# Patient Record
Sex: Male | Born: 1955 | Race: White | Hispanic: No | Smoking: Current every day smoker
Health system: Southern US, Community
[De-identification: ages and names within clinical notes are randomized; demographics above are authoritative.]

## PROBLEM LIST (undated history)

## (undated) DIAGNOSIS — B20 Human immunodeficiency virus [HIV] disease: Secondary | ICD-10-CM

## (undated) DIAGNOSIS — Z87442 Personal history of urinary calculi: Secondary | ICD-10-CM

## (undated) DIAGNOSIS — G720 Drug-induced myopathy: Secondary | ICD-10-CM

## (undated) DIAGNOSIS — R519 Headache, unspecified: Secondary | ICD-10-CM

## (undated) DIAGNOSIS — J439 Emphysema, unspecified: Secondary | ICD-10-CM

## (undated) DIAGNOSIS — I10 Essential (primary) hypertension: Secondary | ICD-10-CM

## (undated) DIAGNOSIS — G43909 Migraine, unspecified, not intractable, without status migrainosus: Secondary | ICD-10-CM

## (undated) DIAGNOSIS — I471 Supraventricular tachycardia, unspecified: Secondary | ICD-10-CM

## (undated) DIAGNOSIS — Z21 Asymptomatic human immunodeficiency virus [HIV] infection status: Secondary | ICD-10-CM

## (undated) DIAGNOSIS — I2510 Atherosclerotic heart disease of native coronary artery without angina pectoris: Secondary | ICD-10-CM

## (undated) DIAGNOSIS — N401 Enlarged prostate with lower urinary tract symptoms: Secondary | ICD-10-CM

## (undated) DIAGNOSIS — E119 Type 2 diabetes mellitus without complications: Secondary | ICD-10-CM

## (undated) HISTORY — PX: OTHER SURGICAL HISTORY: SHX169

## (undated) HISTORY — DX: Human immunodeficiency virus (HIV) disease: B20

## (undated) HISTORY — PX: APPENDECTOMY: SHX54

## (undated) HISTORY — DX: Essential (primary) hypertension: I10

## (undated) HISTORY — PX: CHOLECYSTECTOMY: SHX55

## (undated) HISTORY — PX: COLONOSCOPY: SHX174

## (undated) HISTORY — DX: Type 2 diabetes mellitus without complications: E11.9

## (undated) HISTORY — PX: ESOPHAGOGASTRODUODENOSCOPY: SHX1529

## (undated) HISTORY — PX: TRIGGER FINGER RELEASE: SHX641

---

## 1999-08-20 ENCOUNTER — Emergency Department (HOSPITAL_COMMUNITY): Admission: EM | Admit: 1999-08-20 | Discharge: 1999-08-20 | Payer: Self-pay | Admitting: Emergency Medicine

## 2006-01-08 ENCOUNTER — Emergency Department: Payer: Self-pay | Admitting: Emergency Medicine

## 2008-03-19 ENCOUNTER — Ambulatory Visit: Payer: Self-pay | Admitting: Internal Medicine

## 2008-03-26 ENCOUNTER — Ambulatory Visit: Payer: Self-pay | Admitting: Unknown Physician Specialty

## 2008-04-03 ENCOUNTER — Ambulatory Visit: Payer: Self-pay | Admitting: Internal Medicine

## 2008-04-18 ENCOUNTER — Ambulatory Visit: Payer: Self-pay | Admitting: Internal Medicine

## 2008-05-19 ENCOUNTER — Ambulatory Visit: Payer: Self-pay | Admitting: Internal Medicine

## 2008-05-22 ENCOUNTER — Ambulatory Visit: Payer: Self-pay | Admitting: Internal Medicine

## 2008-06-18 ENCOUNTER — Ambulatory Visit: Payer: Self-pay | Admitting: Internal Medicine

## 2008-09-16 ENCOUNTER — Ambulatory Visit: Payer: Self-pay | Admitting: Internal Medicine

## 2008-09-18 ENCOUNTER — Ambulatory Visit: Payer: Self-pay | Admitting: Internal Medicine

## 2008-10-17 ENCOUNTER — Ambulatory Visit: Payer: Self-pay | Admitting: Internal Medicine

## 2008-11-20 ENCOUNTER — Ambulatory Visit: Payer: Self-pay | Admitting: Urology

## 2009-03-07 ENCOUNTER — Ambulatory Visit: Payer: Self-pay | Admitting: Specialist

## 2009-06-18 ENCOUNTER — Ambulatory Visit: Payer: Self-pay | Admitting: Gastroenterology

## 2009-11-24 ENCOUNTER — Ambulatory Visit: Payer: Self-pay | Admitting: Urology

## 2009-12-09 ENCOUNTER — Ambulatory Visit: Payer: Self-pay | Admitting: General Practice

## 2009-12-17 ENCOUNTER — Ambulatory Visit: Payer: Self-pay | Admitting: General Practice

## 2009-12-19 ENCOUNTER — Ambulatory Visit: Payer: Self-pay | Admitting: Urology

## 2009-12-24 ENCOUNTER — Ambulatory Visit: Payer: Self-pay | Admitting: Urology

## 2009-12-25 ENCOUNTER — Emergency Department: Payer: Self-pay | Admitting: Emergency Medicine

## 2010-08-29 IMAGING — CR DG ABDOMEN 1V
1 series · 2 of 2 positions shown · non-contrast
Comparison: none

REASON FOR EXAM: pain after removing stent
COMMENTS:

[Series 1: view not recorded · 0.17mm/px · 2 of 2 slices shown]
[im 1/2]
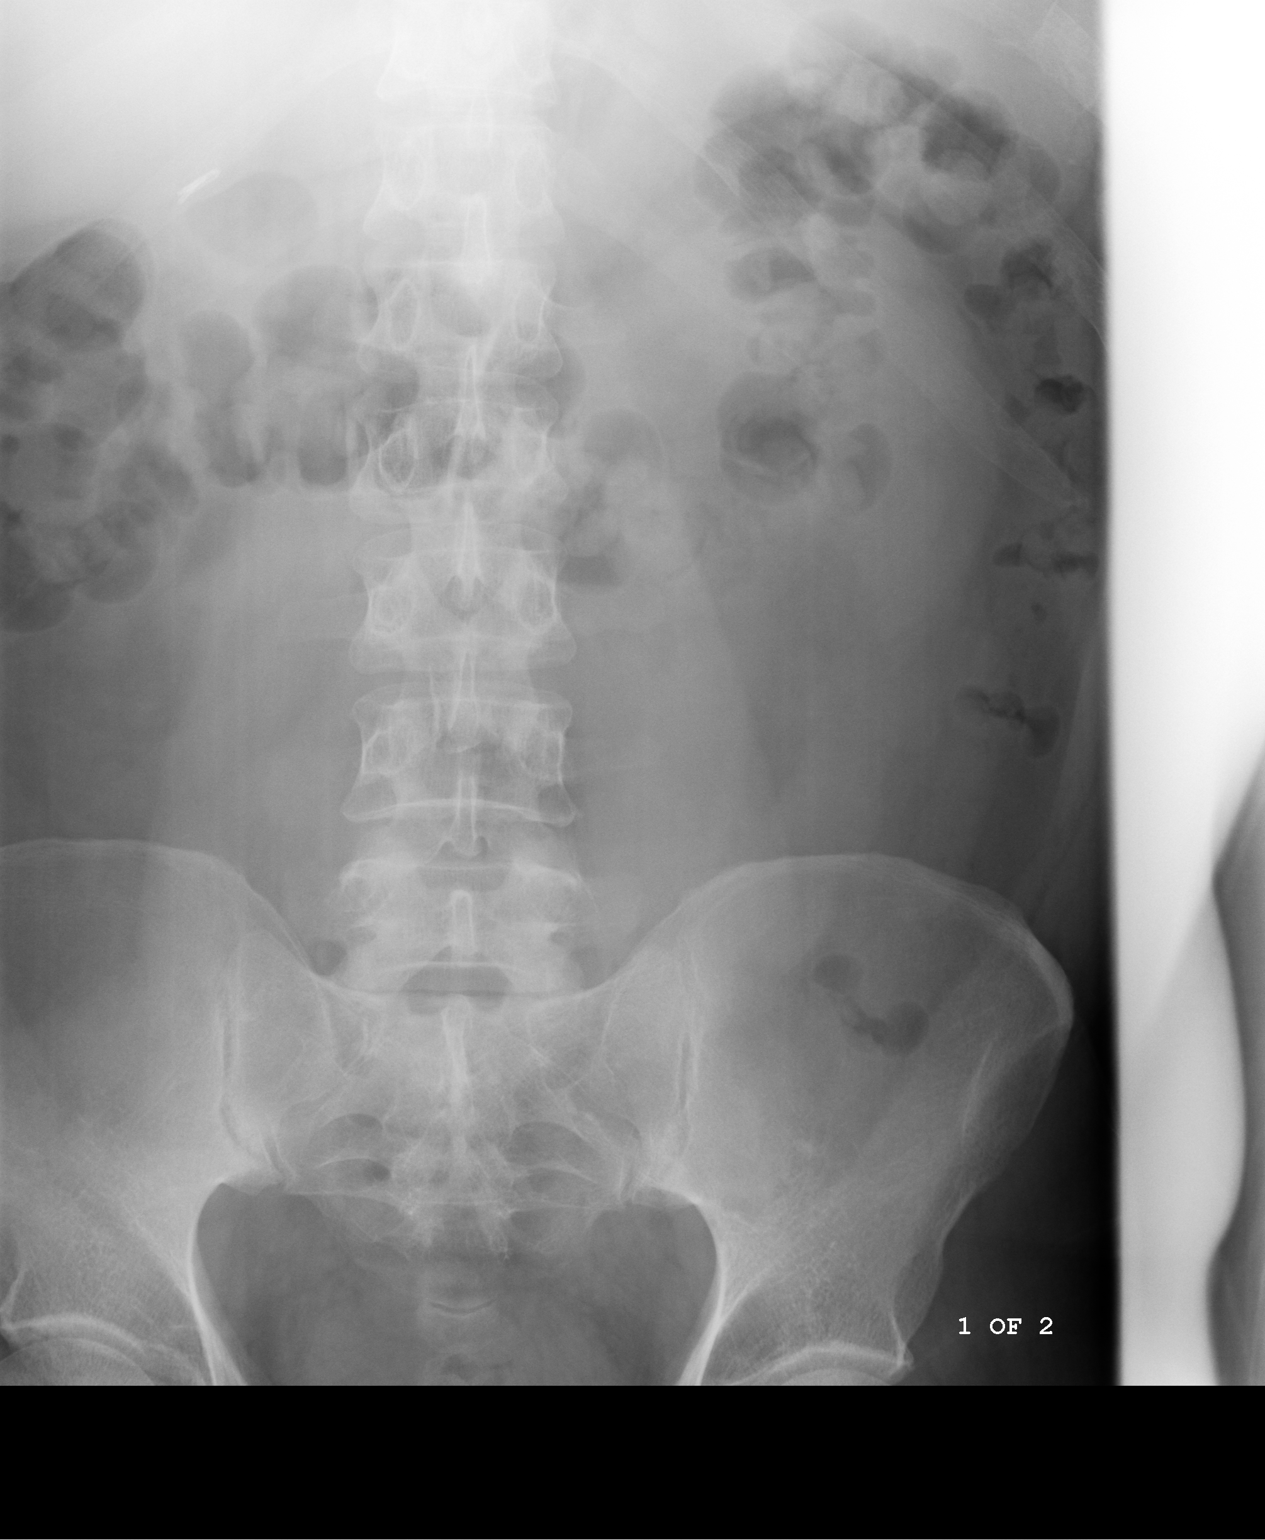
[im 2/2]
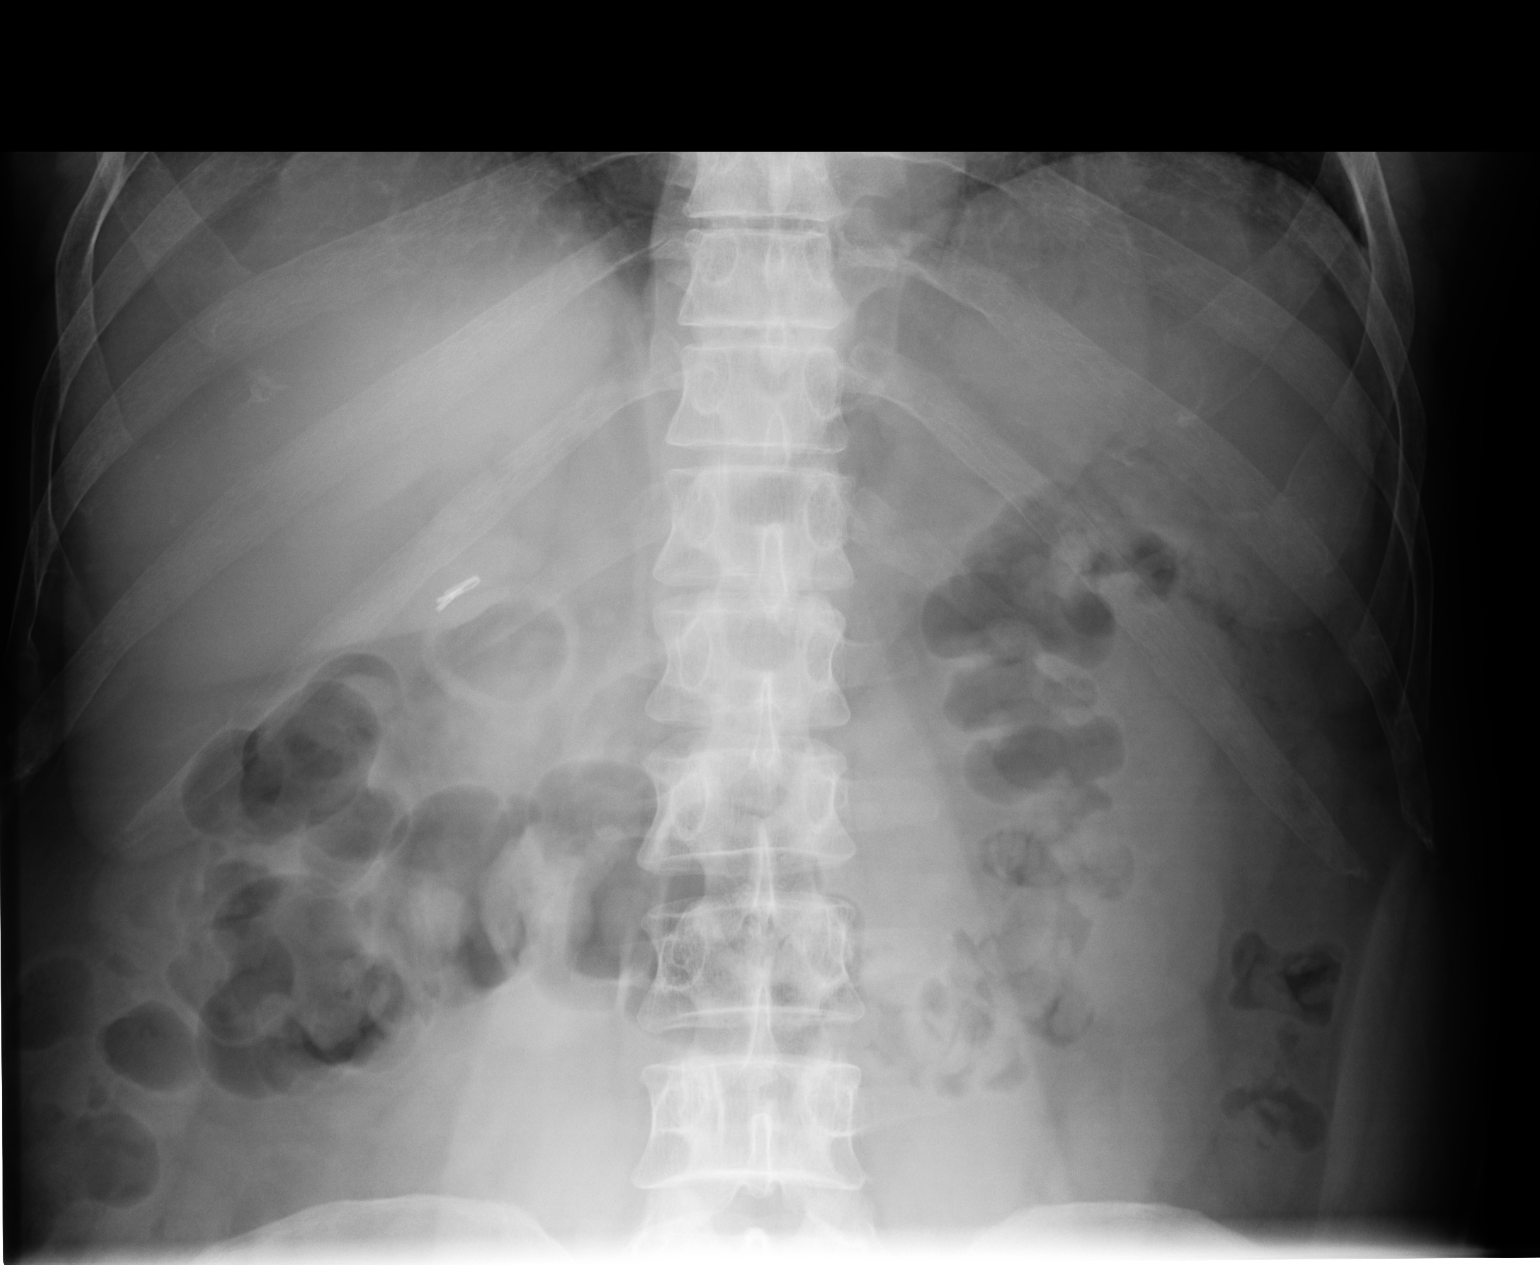

[2 of 2 positions shown; findings below may reference images not displayed]

PROCEDURE:     DXR - DXR KIDNEY URETER BLADDER  - December 25, 2009 [DATE]

RESULT:     AP view of the abdomen was obtained. No ureteral stent is seen
consistent with the history that the stent has been removed. No residual
fragment is observed. No definite renal or ureteral calcifications are
noted. The bowel gas pattern is normal. Postoperative metallic clips are
noted in the right upper quadrant. The psoas margins are visualized
bilaterally. The osseous structures are normal in appearance.
IMPRESSION: 1.     No significant abnormalities are noted.

## 2011-01-07 ENCOUNTER — Ambulatory Visit: Payer: Self-pay | Admitting: Urology

## 2011-03-18 ENCOUNTER — Ambulatory Visit: Payer: Self-pay | Admitting: Urology

## 2011-09-23 ENCOUNTER — Ambulatory Visit: Payer: Self-pay | Admitting: Internal Medicine

## 2012-03-07 ENCOUNTER — Ambulatory Visit: Payer: Self-pay | Admitting: Specialist

## 2013-01-10 ENCOUNTER — Ambulatory Visit: Payer: Self-pay | Admitting: Urology

## 2014-05-28 ENCOUNTER — Ambulatory Visit: Payer: Self-pay | Admitting: Internal Medicine

## 2014-06-18 ENCOUNTER — Ambulatory Visit: Payer: Self-pay | Admitting: Internal Medicine

## 2014-07-19 ENCOUNTER — Ambulatory Visit: Payer: Self-pay | Admitting: Internal Medicine

## 2017-11-23 ENCOUNTER — Telehealth: Payer: Self-pay | Admitting: *Deleted

## 2017-11-23 NOTE — Telephone Encounter (Signed)
Received referral for low dose lung cancer screening CT scan. Message left at phone number listed in EMR for patient to call me back to facilitate scheduling scan.  

## 2017-11-24 ENCOUNTER — Telehealth: Payer: Self-pay | Admitting: *Deleted

## 2017-11-24 DIAGNOSIS — Z122 Encounter for screening for malignant neoplasm of respiratory organs: Secondary | ICD-10-CM

## 2017-11-24 DIAGNOSIS — Z87891 Personal history of nicotine dependence: Secondary | ICD-10-CM

## 2017-11-24 NOTE — Telephone Encounter (Signed)
Received referral for initial lung cancer screening scan. Contacted patient and obtained smoking history,(current, 51.25 pack year) as well as answering questions related to screening process. Patient denies signs of lung cancer such as weight loss or hemoptysis. Patient denies comorbidity that would prevent curative treatment if lung cancer were found. Patient is scheduled for shared decision making visit and CT scan on 12/08/17.

## 2017-12-08 ENCOUNTER — Encounter: Payer: Self-pay | Admitting: Nurse Practitioner

## 2017-12-08 ENCOUNTER — Ambulatory Visit
Admission: RE | Admit: 2017-12-08 | Discharge: 2017-12-08 | Disposition: A | Discharge status: Home / Self Care | Payer: Managed Care, Other (non HMO) | Source: Ambulatory Visit | Attending: Nurse Practitioner | Admitting: Nurse Practitioner

## 2017-12-08 ENCOUNTER — Inpatient Hospital Stay: Payer: Managed Care, Other (non HMO) | Attending: Nurse Practitioner | Admitting: Nurse Practitioner

## 2017-12-08 DIAGNOSIS — R590 Localized enlarged lymph nodes: Secondary | ICD-10-CM | POA: Diagnosis not present

## 2017-12-08 DIAGNOSIS — Z8572 Personal history of non-Hodgkin lymphomas: Secondary | ICD-10-CM | POA: Diagnosis not present

## 2017-12-08 DIAGNOSIS — Z87891 Personal history of nicotine dependence: Secondary | ICD-10-CM

## 2017-12-08 DIAGNOSIS — J439 Emphysema, unspecified: Secondary | ICD-10-CM | POA: Insufficient documentation

## 2017-12-08 DIAGNOSIS — Z122 Encounter for screening for malignant neoplasm of respiratory organs: Secondary | ICD-10-CM

## 2017-12-08 DIAGNOSIS — I700 Atherosclerosis of aorta: Secondary | ICD-10-CM | POA: Insufficient documentation

## 2017-12-08 NOTE — Progress Notes (Signed)
In accordance with CMS guidelines, patient has met eligibility criteria including age, absence of signs or symptoms of lung cancer.  Social History   Tobacco Use  . Smoking status: Current Every Day Smoker    Packs/day: 1.25    Years: 41.00    Pack years: 51.25    Types: Cigarettes  Substance Use Topics  . Alcohol use: Not on file  . Drug use: Not on file      A shared decision-making session was conducted prior to the performance of CT scan. This includes one or more decision aids, includes benefits and harms of screening, follow-up diagnostic testing, over-diagnosis, false positive rate, and total radiation exposure.   Counseling on the importance of adherence to annual lung cancer LDCT screening, impact of co-morbidities, and ability or willingness to undergo diagnosis and treatment is imperative for compliance of the program.   Counseling on the importance of continued smoking cessation for former smokers; the importance of smoking cessation for current smokers, and information about tobacco cessation interventions have been given to patient including [place] [facility] and [facility] quit [place] programs.   Written order for lung cancer screening with LDCT has been given to the patient and any and all questions have been answered to the best of my abilities.    Yearly follow up will be coordinated by Shawn Perkins, Thoracic Navigator.  Lauren Allen, DNP, AGNP-C [facility] [phone] (work cell) [phone] (office) 12/08/17 5:10 PM   

## 2017-12-14 ENCOUNTER — Telehealth: Payer: Self-pay | Admitting: *Deleted

## 2017-12-14 NOTE — Telephone Encounter (Signed)
Patient called and given lung screening results by [facility] RN thoracic navigator. Also reviewed incidental findings. Patient's PCP will receive a copy of the CT report.

## 2018-08-15 ENCOUNTER — Encounter: Payer: Self-pay | Admitting: Infectious Diseases

## 2018-08-15 ENCOUNTER — Ambulatory Visit: Payer: Managed Care, Other (non HMO) | Attending: Infectious Diseases | Admitting: Infectious Diseases

## 2018-08-15 ENCOUNTER — Other Ambulatory Visit
Admission: RE | Admit: 2018-08-15 | Discharge: 2018-08-15 | Disposition: A | Discharge status: Home / Self Care | Payer: Managed Care, Other (non HMO) | Source: Ambulatory Visit | Attending: Infectious Diseases | Admitting: Infectious Diseases

## 2018-08-15 VITALS — BP 151/85 | HR 107 | Temp 97.4°F | Ht 70.0 in | Wt 202.5 lb

## 2018-08-15 DIAGNOSIS — Z7984 Long term (current) use of oral hypoglycemic drugs: Secondary | ICD-10-CM

## 2018-08-15 DIAGNOSIS — B20 Human immunodeficiency virus [HIV] disease: Secondary | ICD-10-CM | POA: Diagnosis present

## 2018-08-15 DIAGNOSIS — Z881 Allergy status to other antibiotic agents status: Secondary | ICD-10-CM

## 2018-08-15 DIAGNOSIS — Z9049 Acquired absence of other specified parts of digestive tract: Secondary | ICD-10-CM

## 2018-08-15 DIAGNOSIS — Z87442 Personal history of urinary calculi: Secondary | ICD-10-CM

## 2018-08-15 DIAGNOSIS — Z21 Asymptomatic human immunodeficiency virus [HIV] infection status: Secondary | ICD-10-CM

## 2018-08-15 DIAGNOSIS — R251 Tremor, unspecified: Secondary | ICD-10-CM | POA: Diagnosis not present

## 2018-08-15 DIAGNOSIS — F17200 Nicotine dependence, unspecified, uncomplicated: Secondary | ICD-10-CM

## 2018-08-15 DIAGNOSIS — E119 Type 2 diabetes mellitus without complications: Secondary | ICD-10-CM

## 2018-08-15 DIAGNOSIS — Z885 Allergy status to narcotic agent status: Secondary | ICD-10-CM

## 2018-08-15 DIAGNOSIS — I10 Essential (primary) hypertension: Secondary | ICD-10-CM

## 2018-08-15 DIAGNOSIS — G43909 Migraine, unspecified, not intractable, without status migrainosus: Secondary | ICD-10-CM

## 2018-08-15 DIAGNOSIS — Z888 Allergy status to other drugs, medicaments and biological substances status: Secondary | ICD-10-CM

## 2018-08-15 DIAGNOSIS — Z79899 Other long term (current) drug therapy: Secondary | ICD-10-CM

## 2018-08-15 HISTORY — DX: Type 2 diabetes mellitus without complications: E11.9

## 2018-08-15 HISTORY — DX: Essential (primary) hypertension: I10

## 2018-08-15 HISTORY — DX: Human immunodeficiency virus (HIV) disease: B20

## 2018-08-15 LAB — COMPREHENSIVE METABOLIC PANEL
ALT: 21 U/L (ref 0–44)
AST: 15 U/L (ref 15–41)
Albumin: 4.7 g/dL (ref 3.5–5.0)
Alkaline Phosphatase: 91 U/L (ref 38–126)
Anion gap: 8 (ref 5–15)
BUN: 18 mg/dL (ref 8–23)
CALCIUM: 9.6 mg/dL (ref 8.9–10.3)
CO2: 22 mmol/L (ref 22–32)
Chloride: 111 mmol/L (ref 98–111)
Creatinine, Ser: 0.74 mg/dL (ref 0.61–1.24)
GFR calc Af Amer: >60 mL/min (ref >60)
GFR calc non Af Amer: >60 mL/min (ref >60)
Glucose, Bld: 152 mg/dL — ABNORMAL HIGH (ref 70–99)
Potassium: 3.6 mmol/L (ref 3.5–5.1)
Sodium: 141 mmol/L (ref 135–145)
Total Bilirubin: 0.6 mg/dL (ref 0.3–1.2)
Total Protein: 7.3 g/dL (ref 6.5–8.1)

## 2018-08-15 NOTE — Patient Instructions (Signed)
You are here to engage in care- you are on triumeq . Will  do labs today Follow up in 6 months You can take shingrix vaccine

## 2018-08-15 NOTE — Progress Notes (Signed)
NAME: Andrew York  DOB: 06/29/1956  MRN: [id number]  Date/Time: 08/15/2018 10:43 AM Subjective:  REASON FOR CONSULT: To engage in HIV care ? Andrew York is a 63 y.o. male with a history of HIV diagnosed in 2011 was under the care of Dr. Ola Spurr who is no longer in the country so he is transferring his care to me.  Patient was diagnosed in 2011 when he noted to have cervical adenopathy and  primary care physician sent him to ENT who aspirated the lymph node and then he was referred to heme-onc who had done an HIV test and also he underwent a bone marrow biopsy and eventually came to know that he was HIV positive.   His First HIV physician was Dr.Blocker and he was started on Atripla. It was later switched to Triumeq by Dr.Fitzgerald. Pt is 100% adherent to HAARt. Last cd4 from June 2019 is 1253 and % is 46 and VL < 20  HIV diagnosed 2011 Nadir Cd4 >300 OI -shingles HAARt history-atripla, triumeq Acquired thru sex with men Genotype-NA  Gets meds from [facility], [place]  PMH Migraine Renal stone HTN DM Tremor rt hand Nicotine addiction  PSH Appendectomy cholecystectomy Renal stone removal/Ureteral stent Trigger finger release rt 08/22/15  SH Lives on his own  Smoker alcohol weekends Occasional marijuana Not sexually active in 45yr Works at [employer]   FL[place]Dm-mom -alive-89 yr old Father -deceased   Allergies  Allergen Reactions  . Hydrocodone-Acetaminophen Itching  . Oxycodone-Acetaminophen Itching  . Ace Inhibitors Cough    Other reaction(s): Cough   . Diclofenac Potassium Other (See Comments)  . Hydrochlorothiazide Other (See Comments)    Gout Gout   . Other Other (See Comments)  . Statins     Other reaction(s): Muscle Pain  . Sulfamethoxazole-Trimethoprim Other (See Comments)   ? Current Outpatient Medications  Medication Sig Dispense Refill  . abacavir-dolutegravir-lamiVUDine (TRIUMEQ) 600-50-300 MG tablet Take 1 tablet by mouth  daily.    . empagliflozin (JARDIANCE) 25 MG TABS tablet Take 25 mg by mouth daily.    .Marland Kitchenglimepiride (AMARYL) 2 MG tablet Take 2 mg by mouth daily.    .Marland KitchenglipiZIDE-metformin (METAGLIP) 5-500 MG tablet Take 1 tablet by mouth 2 (two) times daily.    .Marland Kitchenlosartan (COZAAR) 50 MG tablet Take 50 mg by mouth daily.    . propranolol (INDERAL) 60 MG tablet Take 60 mg by mouth daily.    .Marland KitchenrOPINIRole (REQUIP) 1 MG tablet Take 1 mg by mouth daily.    . TRULICITY 00.17MBL/3.9QZSOPN Inject 0.75 pens as directed once a week.    . venlafaxine XR (EFFEXOR-XR) 37.5 MG 24 hr capsule Take 1 capsule by mouth daily.     No current facility-administered medications for this visit.     REVIEW OF SYSTEMS:  Const: negative fever, negative chills, negative weight loss Eyes: negative diplopia or visual changes, negative eye pain ENT: negative coryza, negative sore throat Resp: negative cough, hemoptysis, dyspnea Cards: negative for chest pain, palpitations, lower extremity edema GU: negative for frequency, dysuria and hematuria Skin: negative for rash and pruritus Heme: negative for easy bruising and gum/nose bleeding MS: negative for myalgias, arthralgias, back pain and muscle weakness Neurolo:negative for headaches, dizziness, vertigo, memory problems  Psych: negative for feelings of anxiety, depression   Objective:  VITALS:  BP (!) 151/85 (BP Location: Left Arm, Patient Position: Sitting, Cuff Size: Normal)   Pulse (!) 107   Temp (!) 97.4 F (  36.3 C) (Oral)   Ht '5\' 10"'  (1.778 m)   Wt 202 lb 8 oz (91.9 kg)   BMI 29.06 kg/m  PHYSICAL EXAM:  General: Alert, cooperative, no distress, appears stated age.  Head: Normocephalic, without obvious abnormality, atraumatic. Eyes: Conjunctivae clear, anicteric sclerae. Pupils are equal Nose: Nares normal. No drainage or sinus tenderness. Throat: Lips, mucosa, and tongue normal. No Thrush Neck: Supple, symmetrical, no adenopathy, thyroid: non tender no carotid bruit  and no JVD. Back: No CVA tenderness. Lungs: Clear to auscultation bilaterally. No Wheezing or Rhonchi. No rales. Heart: Regular rate and rhythm, no murmur, rub or gallop. Abdomen: truncal obesity ,Soft, non-tender,not distended. Bowel sounds normal. No masses Extremities: Extremities normal, atraumatic, no cyanosis. No edema. No clubbing Skin: No rashes or lesions. Not Jaundiced Lymph: Cervical, supraclavicular normal. Neurologic: Grossly non-focal Pertinent Labs None  IMAGING RESULTS: Health maintenance Vaccination pneumovac- 23-on 08/31/2016 Prevnar-13- given 07/06/16 HepB HepA TdaP-08/08/2018 Flu-04/25/18 Herpes zoster- HPV -NI ______________________ labs RPR HEPC ab Lipid-TC 118, TGL 230, HDL 25.4, LDL 47 CMv TOXO -IGG/IgM HIV VL Cd4 quantiferon Gold GC/CHL [id number] Genotype HIV antibody  Preventive  Dental-twice a year- uptodate Colonoscopy-06/24/10 Opthal Anal   Impression/Recommendation ? HIV- on triumeq-100% adherent, last labs from June 2019 reviewed- Vl < 20 and cd4 > 1000 Will get labs today- VL, cd4, Quantiferon gold, rpr, hepc   Health maintenance-need to be updated He can get shingrix vaccine as his immune system is very robust  DM-on JArdiance, trulicity, metformin and glimeparide- managed by his PCP-last Hba1c from Jan 2020 is 8.2  HTN on losartan and propanalol ? ?Nicotine dependence= pt is not ready or willing to quit  Discussed the pathogenesis, adherence, labs, and side effects of meds Follow up in 6 months

## 2018-08-16 LAB — T-HELPER CELLS CD4/CD8 %
% CD 4 Pos. Lymph.: 48.3 % (ref 30.8–58.5)
Absolute CD 4 Helper: 1111 /uL (ref 359–1519)
Basophils Absolute: 0.1 10*3/uL (ref 0.0–0.2)
Basos: 1 %
CD3+CD4+ Cells/CD3+CD8+ Cells Bld: 1.26 (ref 0.92–3.72)
CD3+CD8+ Cells # Bld: 883 /uL (ref 109–897)
CD3+CD8+ Cells NFr Bld: 38.4 % — ABNORMAL HIGH (ref 12.0–35.5)
EOS (ABSOLUTE): 0.1 10*3/uL (ref 0.0–0.4)
Eos: 1 %
Hematocrit: 50.3 % (ref 37.5–51.0)
Hemoglobin: 17.1 g/dL (ref 13.0–17.7)
Immature Grans (Abs): 0 10*3/uL (ref 0.0–0.1)
Immature Granulocytes: 0 %
Lymphocytes Absolute: 2.3 10*3/uL (ref 0.7–3.1)
Lymphs: 25 %
MCH: 30.2 pg (ref 26.6–33.0)
MCHC: 34 g/dL (ref 31.5–35.7)
MCV: 89 fL (ref 79–97)
Monocytes Absolute: 0.6 10*3/uL (ref 0.1–0.9)
Monocytes: 7 %
NEUTROS ABS: 6 10*3/uL (ref 1.4–7.0)
Neutrophils: 66 %
Platelets: 204 10*3/uL (ref 150–450)
RBC: 5.67 x10E6/uL (ref 4.14–5.80)
RDW: 14.3 % (ref 11.6–15.4)
WBC: 9.1 10*3/uL (ref 3.4–10.8)

## 2018-08-16 LAB — HEPATITIS PANEL, ACUTE
HCV Ab: <0.1 s/co ratio (ref 0.0–0.9)
Hep A IgM: NEGATIVE
Hep B C IgM: NEGATIVE
Hepatitis B Surface Ag: NEGATIVE

## 2018-08-16 LAB — RPR: RPR Ser Ql: NONREACTIVE

## 2018-08-16 LAB — HIV-1 RNA QUANT-NO REFLEX-BLD
HIV 1 RNA Quant: <20 copies/mL
LOG10 HIV-1 RNA: UNDETERMINED log10copy/mL

## 2018-08-16 LAB — HEPATITIS B SURFACE ANTIBODY, QUANTITATIVE: Hep B S AB Quant (Post): <3.1 m[IU]/mL — ABNORMAL LOW (ref >9.9)

## 2018-08-17 LAB — QUANTIFERON-TB GOLD PLUS (RQFGPL)
QuantiFERON Mitogen Value: >10 IU/mL
QuantiFERON Nil Value: 0.01 IU/mL
QuantiFERON TB1 Ag Value: 0.02 IU/mL
QuantiFERON TB2 Ag Value: 0.02 IU/mL

## 2018-08-17 LAB — QUANTIFERON-TB GOLD PLUS: QuantiFERON-TB Gold Plus: NEGATIVE

## 2019-01-02 ENCOUNTER — Telehealth: Payer: Self-pay | Admitting: *Deleted

## 2019-01-02 DIAGNOSIS — Z87891 Personal history of nicotine dependence: Secondary | ICD-10-CM

## 2019-01-02 DIAGNOSIS — Z122 Encounter for screening for malignant neoplasm of respiratory organs: Secondary | ICD-10-CM

## 2019-01-02 NOTE — Telephone Encounter (Signed)
Patient has been notified that annual lung cancer screening low dose CT scan is due currently or will be in near future. Confirmed that patient is within the age range of 55-77, and asymptomatic, (no signs or symptoms of lung cancer). Patient denies illness that would prevent curative treatment for lung cancer if found. Verified smoking history, (current, 52.25 pack year). The shared decision making visit was done 12/08/17. Patient is agreeable for CT scan being scheduled.

## 2019-01-09 ENCOUNTER — Ambulatory Visit
Admission: RE | Admit: 2019-01-09 | Discharge: 2019-01-09 | Disposition: A | Discharge status: Home / Self Care | Payer: Managed Care, Other (non HMO) | Source: Ambulatory Visit | Attending: Oncology | Admitting: Oncology

## 2019-01-09 ENCOUNTER — Other Ambulatory Visit: Payer: Self-pay

## 2019-01-09 DIAGNOSIS — Z87891 Personal history of nicotine dependence: Secondary | ICD-10-CM

## 2019-01-09 DIAGNOSIS — Z122 Encounter for screening for malignant neoplasm of respiratory organs: Secondary | ICD-10-CM

## 2019-01-10 ENCOUNTER — Encounter: Payer: Self-pay | Admitting: *Deleted

## 2019-02-13 ENCOUNTER — Ambulatory Visit: Payer: Managed Care, Other (non HMO) | Attending: Infectious Diseases | Admitting: Infectious Diseases

## 2019-02-13 ENCOUNTER — Other Ambulatory Visit: Payer: Self-pay

## 2019-02-13 ENCOUNTER — Encounter: Payer: Self-pay | Admitting: Infectious Diseases

## 2019-02-13 VITALS — BP 160/96 | HR 109 | Temp 97.7°F | Ht 70.0 in | Wt 204.0 lb

## 2019-02-13 DIAGNOSIS — E119 Type 2 diabetes mellitus without complications: Secondary | ICD-10-CM

## 2019-02-13 DIAGNOSIS — I10 Essential (primary) hypertension: Secondary | ICD-10-CM | POA: Diagnosis not present

## 2019-02-13 DIAGNOSIS — Z79899 Other long term (current) drug therapy: Secondary | ICD-10-CM

## 2019-02-13 DIAGNOSIS — Z7984 Long term (current) use of oral hypoglycemic drugs: Secondary | ICD-10-CM

## 2019-02-13 DIAGNOSIS — B20 Human immunodeficiency virus [HIV] disease: Secondary | ICD-10-CM

## 2019-02-13 DIAGNOSIS — Z885 Allergy status to narcotic agent status: Secondary | ICD-10-CM

## 2019-02-13 DIAGNOSIS — Z888 Allergy status to other drugs, medicaments and biological substances status: Secondary | ICD-10-CM

## 2019-02-13 DIAGNOSIS — F17200 Nicotine dependence, unspecified, uncomplicated: Secondary | ICD-10-CM

## 2019-02-13 DIAGNOSIS — Z881 Allergy status to other antibiotic agents status: Secondary | ICD-10-CM

## 2019-02-13 MED ORDER — ABACAVIR-DOLUTEGRAVIR-LAMIVUD 600-50-300 MG PO TABS: 1.0000 | ORAL_TABLET | Freq: Every day | ORAL | 6 refills | Status: DC

## 2019-02-13 NOTE — Progress Notes (Signed)
NAME: Andrew York  DOB: [birth year]  MRN: [id number]  Date/Time: 02/13/2019 9:20 AM  Subjective:  Follow up visit  First visit 08/15/18 ? Andrew York is a 63 y.o. with a history of  HIV, HTN, DM  Is here for follow up. On triumeq and doing well- last Vl < 20 and CD4 is 1111 from 08/15/18. Since his last visit he has not had any changes to his health, no ER or hospital visits or surgery. -saw his PCP in May 2020.losartan increased to 100mg . 100% Percent adherent to HAART.  HIV diagnosed 2011  Nadir Cd4>300 OI -shingles  HAARt history-atripla, triumeq Acquired thru sex with men Genotype-NA  PMH Migraine Renal stone HTN DM Tremor rt hand Nicotine addiction Statin myopathy   PSH Appendectomy cholecystectomy Renal stone removal/Ureteral stent Trigger finger release rt 08/22/15  SH Lives on his own  Smoker alcohol weekends Occasional marijuana Not sexually active in 30yrs Works at [employer]    [place] Dm-mom -alive-89 yr old Father -deceased  Allergies  Allergen Reactions  . Hydrocodone-Acetaminophen Itching  . Oxycodone-Acetaminophen Itching  . Ace Inhibitors Cough    Other reaction(s): Cough   . Diclofenac Potassium Other (See Comments)  . Hydrochlorothiazide Other (See Comments)    Gout Gout   . Other Other (See Comments)  . Statins     Other reaction(s): Muscle Pain  . Sulfamethoxazole-Trimethoprim Other (See Comments)   ? Current Outpatient Medications  Medication Sig Dispense Refill  . abacavir-dolutegravir-lamiVUDine (TRIUMEQ) 600-50-300 MG tablet Take 1 tablet by mouth daily.    . empagliflozin (JARDIANCE) 25 MG TABS tablet Take 25 mg by mouth daily.    Marland Kitchen glimepiride (AMARYL) 2 MG tablet Take 2 mg by mouth daily.    Marland Kitchen ipratropium (ATROVENT HFA) 17 MCG/ACT inhaler Inhale 2 Inhalers into the lungs 4 (four) times daily.    Marland Kitchen losartan (COZAAR) 50 MG tablet Take 50 mg by mouth daily.    . metFORMIN (GLUCOPHAGE-XR) 500 MG 24 hr tablet Take  2,000 mg by mouth daily.    . propranolol (INDERAL) 60 MG tablet Take 60 mg by mouth daily.    Marland Kitchen rOPINIRole (REQUIP) 1 MG tablet Take 1 mg by mouth daily.    . TRULICITY 9.92 EQ/6.8TM SOPN Inject 0.75 pens as directed once a week.    . venlafaxine XR (EFFEXOR-XR) 37.5 MG 24 hr capsule Take 1 capsule by mouth daily.    Marland Kitchen glipiZIDE-metformin (METAGLIP) 5-500 MG tablet Take 1 tablet by mouth 2 (two) times daily.     No current facility-administered medications for this visit.     REVIEW OF SYSTEMS:  Const: negative fever, negative chills, negative weight loss Eyes: negative diplopia or visual changes, negative eye pain ENT: negative coryza, negative sore throat Resp: negative cough, hemoptysis, dyspnea Cards: negative for chest pain, palpitations, lower extremity edema GU: negative for frequency, dysuria and hematuria Skin: negative for rash and pruritus Heme: negative for easy bruising and gum/nose bleeding MS: negative for myalgias, arthralgias, back pain and muscle weakness Neurolo:negative for headaches, dizziness, vertigo, memory problems  Psych: negative for feelings of anxiety, depression   Objective:  VITALS:  BP (!) 160/96 (BP Location: Left Arm, Patient Position: Sitting, Cuff Size: Normal)   Pulse (!) 109   Temp 97.7 F (36.5 C) (Oral)   Ht 5\' 10"  (1.778 m)   Wt 204 lb (92.5 kg)   BMI 29.27 kg/m  PHYSICAL EXAM:  General: Alert, cooperative, no distress, appears stated age. obese Head: Normocephalic,  without obvious abnormality, atraumatic. Eyes: Conjunctivae clear, anicteric sclerae. Pupils are equal Nose: Nares normal. No drainage or sinus tenderness. Throat: Lips, mucosa, and tongue normal. No Thrush Neck: Supple, symmetrical, no adenopathy, thyroid: non tender no carotid bruit and no JVD. Back: No CVA tenderness. Lungs: Clear to auscultation bilaterally. No Wheezing or Rhonchi. No rales. Heart: Regular rate and rhythm, no murmur, rub or gallop. Abdomen: Soft,  non-tender,not distended. Bowel sounds normal. No masses Extremities: Extremities normal, atraumatic, no cyanosis. No edema. No clubbing Skin: No rashes or lesions. Not Jaundiced Lymph: Cervical, supraclavicular normal. Neurologic: Grossly non-focal Pertinent Labs As below  Health maintenance Vaccination  Vaccine Date last given comment  Influenza    Hepatitis B    Hepatitis A    Prevnar-PCV-13 07/06/16   Pneumovac-PPSV-23 08/31/16   TdaP 08/08/18   HPV    Shingrix ( zoster vaccine)     ______________________  Labs Lab Result  Date comment  HIV VL <20 08/15/18   CD4 1111 08/15/18   Genotype     [id number]     HIV antibody     RPR NR 08/15/18   Quantiferon Gold NEG 08/15/18   Hep C ab NR 08/15/18   Hepatitis B-ab,ag,c Neg 08/15/18   Hepatitis A-IgM, IgG /T Neg 08/15/18   Lipid 109/176/24.8/49 11/2018   GC/CHL     PAP     HB,PLT,Cr, LFT N 08/15/18     Preventive  Procedure Result  Date comment  colonoscopy U    Dental exam Uptodate    Opthal Uptodate      Impression/Recommendation ? ?HIV- on triumeq-100% adherent, last labs from June 2019 reviewed- Vl < 20 and cd4 is 1111   Health maintenance-updated Will need HEPB/vaccine- he wants to check with his insurance to see whether it is covered  He can get shingrix vaccine as his immune system is very robust but he does not want to  DM-on JArdiance, trulicity, metformin and glimeparide- managed by his PCP-last Hba1c from Jan 2020 is 8.2  HTN on losartan and propanalol ? ?Nicotine dependence= pt is not ready or willing to quit  ? ___________________________________________________ Discussed with patient in detail Follow up 6 months

## 2019-02-13 NOTE — Patient Instructions (Signed)
You are doing well=on triumeq- last Vl < 20 and Cd4 1111. Check with insurance to see whether HepB vaccine is covered

## 2019-08-21 ENCOUNTER — Ambulatory Visit: Payer: Managed Care, Other (non HMO) | Attending: Infectious Diseases | Admitting: Infectious Diseases

## 2019-08-21 ENCOUNTER — Encounter: Payer: Self-pay | Admitting: Infectious Diseases

## 2019-08-21 ENCOUNTER — Other Ambulatory Visit
Admission: RE | Admit: 2019-08-21 | Discharge: 2019-08-21 | Disposition: A | Discharge status: Home / Self Care | Payer: Managed Care, Other (non HMO) | Source: Ambulatory Visit | Attending: Infectious Diseases | Admitting: Infectious Diseases

## 2019-08-21 ENCOUNTER — Other Ambulatory Visit: Payer: Self-pay

## 2019-08-21 VITALS — BP 158/90 | HR 100 | Resp 16 | Ht 70.0 in | Wt 201.4 lb

## 2019-08-21 DIAGNOSIS — Z885 Allergy status to narcotic agent status: Secondary | ICD-10-CM | POA: Insufficient documentation

## 2019-08-21 DIAGNOSIS — Z79899 Other long term (current) drug therapy: Secondary | ICD-10-CM | POA: Insufficient documentation

## 2019-08-21 DIAGNOSIS — Z888 Allergy status to other drugs, medicaments and biological substances status: Secondary | ICD-10-CM | POA: Diagnosis not present

## 2019-08-21 DIAGNOSIS — Z882 Allergy status to sulfonamides status: Secondary | ICD-10-CM | POA: Diagnosis not present

## 2019-08-21 DIAGNOSIS — B20 Human immunodeficiency virus [HIV] disease: Secondary | ICD-10-CM | POA: Insufficient documentation

## 2019-08-21 DIAGNOSIS — F17200 Nicotine dependence, unspecified, uncomplicated: Secondary | ICD-10-CM | POA: Insufficient documentation

## 2019-08-21 DIAGNOSIS — Z881 Allergy status to other antibiotic agents status: Secondary | ICD-10-CM

## 2019-08-21 DIAGNOSIS — E119 Type 2 diabetes mellitus without complications: Secondary | ICD-10-CM | POA: Insufficient documentation

## 2019-08-21 DIAGNOSIS — Z7984 Long term (current) use of oral hypoglycemic drugs: Secondary | ICD-10-CM | POA: Insufficient documentation

## 2019-08-21 DIAGNOSIS — I10 Essential (primary) hypertension: Secondary | ICD-10-CM | POA: Insufficient documentation

## 2019-08-21 MED ORDER — ABACAVIR-DOLUTEGRAVIR-LAMIVUD 600-50-300 MG PO TABS: 1.0000 | ORAL_TABLET | Freq: Every day | ORAL | 6 refills | Status: DC

## 2019-08-21 NOTE — Progress Notes (Signed)
NAME: Andrew York  DOB: 11-16-1955  MRN: [id number]  Date/Time: 08/21/2019 9:09 AM  Subjective:  Follow up visit  Last visit  July 2020 Last labs Jan 2020 ? Andrew York is a 64 y.o. with a history of  HIV, HTN, DM  Is here for follow up. On triumeq and doing well- last Vl < 20 and CD4 is 1111 from 08/15/18. Since his last visit he twisted his rt foot in Nov and was seen in the same day clinic and had xray and was given an Futures trader. He is doing well now. He saw hi spCP today before his visit with me. Dr.Anderson has increased his trulicity. Hba1c ( jan ) was 8.3 He has received 2 doses of shingrix ( zoster vaccine)- last dose was 08/19/19 and he has some redness at the site  100% Percent adherent to HAART.  HIV diagnosed 2011  Nadir Cd4>300 OI -shingles  HAARt history-atripla, triumeq Acquired thru sex with men Genotype-NA  PMH Migraine Renal stone HTN DM Tremor rt hand Nicotine addiction Statin myopathy   PSH Appendectomy cholecystectomy Renal stone removal/Ureteral stent Trigger finger release rt 08/22/15  SH Lives on his own  Smoker alcohol weekends Occasional marijuana Not sexually active in 24yrs Works at [employer]    [place] Dm-mom -alive-90 yr old Father -deceased  Allergies  Allergen Reactions  . Hydrocodone-Acetaminophen Itching  . Oxycodone-Acetaminophen Itching  . Ace Inhibitors Cough    Other reaction(s): Cough   . Diclofenac Potassium(Migraine) Other (See Comments)  . Hydrochlorothiazide Other (See Comments)    Gout Gout   . Other Other (See Comments)  . Statins     Other reaction(s): Muscle Pain  . Sulfamethoxazole-Trimethoprim Other (See Comments)   ? Current Outpatient Medications  Medication Sig Dispense Refill  . abacavir-dolutegravir-lamiVUDine (TRIUMEQ) 600-50-300 MG tablet Take 1 tablet by mouth daily. 30 tablet 6  . empagliflozin (JARDIANCE) 25 MG TABS tablet Take 25 mg by mouth daily.    Marland Kitchen glimepiride  (AMARYL) 2 MG tablet Take 2 mg by mouth daily.    Marland Kitchen glipiZIDE-metformin (METAGLIP) 5-500 MG tablet Take 1 tablet by mouth 2 (two) times daily.    Marland Kitchen losartan (COZAAR) 50 MG tablet Take 50 mg by mouth daily.    . metFORMIN (GLUCOPHAGE-XR) 500 MG 24 hr tablet Take 2,000 mg by mouth daily.    . propranolol (INDERAL) 60 MG tablet Take 60 mg by mouth daily.    Marland Kitchen rOPINIRole (REQUIP) 1 MG tablet Take 1 mg by mouth daily.    . TRULICITY 0.75 MG/0.5ML SOPN Inject 0.75 pens as directed once a week.    . venlafaxine XR (EFFEXOR-XR) 37.5 MG 24 hr capsule Take 1 capsule by mouth daily.     No current facility-administered medications for this visit.    REVIEW OF SYSTEMS:  Const: negative fever, negative chills, negative weight loss Eyes: negative diplopia or visual changes, negative eye pain ENT: negative coryza, negative sore throat Resp: negative cough, hemoptysis, dyspnea Cards: negative for chest pain, palpitations, lower extremity edema GU: negative for frequency, dysuria and hematuria Skin: negative for rash and pruritus Heme: negative for easy bruising and gum/nose bleeding MS: negative for myalgias, arthralgias, back pain and muscle weakness Neurolo:negative for headaches, dizziness, vertigo, memory problems  Psych: negative for feelings of anxiety, depression   Objective:  VITALS:  BP (!) 158/90   Pulse 100   Resp 16   Ht 5\' 10"  (1.778 m)   Wt 201 lb 6.4 oz (91.4  kg)   SpO2 94%   BMI 28.90 kg/m PHYSICAL EXAM:  General: Alert, cooperative, no distress, appears stated age. obese Head: Normocephalic, without obvious abnormality, atraumatic. Eyes: Conjunctivae clear, anicteric sclerae. Pupils are equal Nose: Nares normal. No drainage or sinus tenderness. Throat: Lips, mucosa, and tongue normal. No Thrush Neck: Supple, symmetrical, no adenopathy, thyroid: non tender no carotid bruit and no JVD. Back: No CVA tenderness. Lungs: Clear to auscultation bilaterally. No Wheezing or Rhonchi.  No rales. Heart: Regular rate and rhythm, no murmur, rub or gallop. Abdomen: Soft, non-tender,not distended. Bowel sounds normal. No masses Extremities: redness below  the site of shingrix vaccine    Skin: No rashes or lesions. Not Jaundiced Lymph: Cervical, supraclavicular normal. Neurologic: Grossly non-focal Pertinent Labs As below  Health maintenance Vaccination  Vaccine Date last given comment  Influenza    Hepatitis B    Hepatitis A    Prevnar-PCV-13 07/06/16   Pneumovac-PPSV-23 08/31/16   TdaP 08/08/18   HPV    Shingrix ( zoster vaccine) 08/19/19 and sept 2020 2 doses received   ______________________  Labs Lab Result  Date comment  HIV VL <20 08/15/18   CD4 1111 08/15/18   Genotype     [id number]     HIV antibody     RPR NR 08/15/18   Quantiferon Gold NEG 08/15/18   Hep C ab NR 08/15/18   Hepatitis B-ab,ag,c Neg 08/15/18   Hepatitis A-IgM, IgG /T Neg 08/15/18   Lipid 120/281/25/38 08/14/19   GC/CHL     PAP     HB,PLT,Cr, LFT Cr 0.9 08/15/18     Preventive  Procedure Result  Date comment  colonoscopy     Dental exam Uptodate NOV 2020   Opthal Uptodate      Impression/Recommendation ? ?HIV- on triumeq-100% adherent, last labs from June 2019 reviewed- Vl < 20 and cd4 is [place] maintenance-updated Will need HEPB/vaccine- he wants to wait till he got his covid vaccine  He got  shingrix vaccine   DM-on JArdiance, trulicity, metformin and glimeparide- managed by his PCP-last Hba1c from Jan 2021 is 8.2  HTN on losartan and propanalol ? ?Nicotine dependence= pt is not ready or willing to quit  ? ___________________________________________________ Discussed with patient in detail Follow up 6 months

## 2019-08-21 NOTE — Patient Instructions (Signed)
You are here for follow up. You are doing well- on triumeq- today will do labs- we have registered for the COVID vaccine waiting list with [facility]. Follow up 6-8 months HEPB vaccine next visit

## 2019-08-22 LAB — T-HELPER CELLS CD4/CD8 %
% CD 4 Pos. Lymph.: 53 % (ref 30.8–58.5)
Absolute CD 4 Helper: 742 /uL (ref 359–1519)
Basophils Absolute: 0 10*3/uL (ref 0.0–0.2)
Basos: 0 %
CD3+CD4+ Cells/CD3+CD8+ Cells Bld: 1.34 (ref 0.92–3.72)
CD3+CD8+ Cells # Bld: 556 /uL (ref 109–897)
CD3+CD8+ Cells NFr Bld: 39.7 % — ABNORMAL HIGH (ref 12.0–35.5)
EOS (ABSOLUTE): 0.1 10*3/uL (ref 0.0–0.4)
Eos: 1 %
Hematocrit: 48.7 % (ref 37.5–51.0)
Hemoglobin: 17 g/dL (ref 13.0–17.7)
Immature Grans (Abs): 0 10*3/uL (ref 0.0–0.1)
Immature Granulocytes: 0 %
Lymphocytes Absolute: 1.4 10*3/uL (ref 0.7–3.1)
Lymphs: 13 %
MCH: 31.4 pg (ref 26.6–33.0)
MCHC: 34.9 g/dL (ref 31.5–35.7)
MCV: 90 fL (ref 79–97)
Monocytes Absolute: 0.8 10*3/uL (ref 0.1–0.9)
Monocytes: 7 %
Neutrophils Absolute: 8.4 10*3/uL — ABNORMAL HIGH (ref 1.4–7.0)
Neutrophils: 79 %
Platelets: 157 10*3/uL (ref 150–450)
RBC: 5.41 x10E6/uL (ref 4.14–5.80)
RDW: 14.4 % (ref 11.6–15.4)
WBC: 10.8 10*3/uL (ref 3.4–10.8)

## 2019-08-22 LAB — HIV-1 RNA QUANT-NO REFLEX-BLD
HIV 1 RNA Quant: <20 copies/mL
LOG10 HIV-1 RNA: UNDETERMINED log10copy/mL

## 2019-08-22 LAB — RPR: RPR Ser Ql: NONREACTIVE

## 2020-01-07 ENCOUNTER — Telehealth: Payer: Self-pay

## 2020-01-07 DIAGNOSIS — Z122 Encounter for screening for malignant neoplasm of respiratory organs: Secondary | ICD-10-CM

## 2020-01-07 DIAGNOSIS — Z87891 Personal history of nicotine dependence: Secondary | ICD-10-CM

## 2020-01-07 NOTE — Telephone Encounter (Signed)
Message left notifying patient that it is time to schedule the low dose lung cancer screening CT scan.  Instructed patient to return call to Shawn Perkins at [phone] to verify information prior to CT scan being scheduled.    

## 2020-01-08 NOTE — Telephone Encounter (Signed)
Patient has been notified that annual lung cancer screening low dose CT scan is due currently or will be in near future. Confirmed that patient is within the age range of 55-77, and asymptomatic, (no signs or symptoms of lung cancer). Patient denies illness that would prevent curative treatment for lung cancer if found. Verified smoking history, (current, 53.25 pack year). The shared decision making visit was done 12/08/17. Patient is agreeable for CT scan being scheduled.

## 2020-01-08 NOTE — Addendum Note (Signed)
Addended by: Jonne Ply on: 01/08/2020 12:25 PM   Modules accepted: Orders

## 2020-01-22 ENCOUNTER — Other Ambulatory Visit: Payer: Self-pay

## 2020-01-22 ENCOUNTER — Ambulatory Visit
Admission: RE | Admit: 2020-01-22 | Discharge: 2020-01-22 | Disposition: A | Discharge status: Home / Self Care | Payer: Managed Care, Other (non HMO) | Source: Ambulatory Visit | Attending: Oncology | Admitting: Oncology

## 2020-01-22 DIAGNOSIS — Z87891 Personal history of nicotine dependence: Secondary | ICD-10-CM | POA: Diagnosis present

## 2020-01-22 DIAGNOSIS — Z122 Encounter for screening for malignant neoplasm of respiratory organs: Secondary | ICD-10-CM | POA: Diagnosis present

## 2020-01-24 ENCOUNTER — Encounter: Payer: Self-pay | Admitting: *Deleted

## 2020-02-19 ENCOUNTER — Other Ambulatory Visit: Payer: Self-pay

## 2020-02-19 ENCOUNTER — Encounter: Payer: Self-pay | Admitting: Infectious Diseases

## 2020-02-19 ENCOUNTER — Other Ambulatory Visit
Admission: RE | Admit: 2020-02-19 | Discharge: 2020-02-19 | Disposition: A | Discharge status: Home / Self Care | Payer: Managed Care, Other (non HMO) | Attending: Infectious Diseases | Admitting: Infectious Diseases

## 2020-02-19 ENCOUNTER — Ambulatory Visit: Payer: Managed Care, Other (non HMO) | Attending: Infectious Diseases | Admitting: Infectious Diseases

## 2020-02-19 VITALS — BP 167/102 | HR 101 | Temp 97.9°F | Resp 16 | Ht 70.0 in | Wt 197.4 lb

## 2020-02-19 DIAGNOSIS — E781 Pure hyperglyceridemia: Secondary | ICD-10-CM

## 2020-02-19 DIAGNOSIS — Z7984 Long term (current) use of oral hypoglycemic drugs: Secondary | ICD-10-CM

## 2020-02-19 DIAGNOSIS — E119 Type 2 diabetes mellitus without complications: Secondary | ICD-10-CM | POA: Insufficient documentation

## 2020-02-19 DIAGNOSIS — F17210 Nicotine dependence, cigarettes, uncomplicated: Secondary | ICD-10-CM | POA: Diagnosis not present

## 2020-02-19 DIAGNOSIS — B20 Human immunodeficiency virus [HIV] disease: Secondary | ICD-10-CM | POA: Diagnosis present

## 2020-02-19 DIAGNOSIS — I10 Essential (primary) hypertension: Secondary | ICD-10-CM | POA: Insufficient documentation

## 2020-02-19 DIAGNOSIS — Z888 Allergy status to other drugs, medicaments and biological substances status: Secondary | ICD-10-CM | POA: Diagnosis not present

## 2020-02-19 DIAGNOSIS — F17200 Nicotine dependence, unspecified, uncomplicated: Secondary | ICD-10-CM

## 2020-02-19 DIAGNOSIS — Z881 Allergy status to other antibiotic agents status: Secondary | ICD-10-CM | POA: Insufficient documentation

## 2020-02-19 LAB — COMPREHENSIVE METABOLIC PANEL
ALT: 22 U/L (ref 0–44)
AST: 19 U/L (ref 15–41)
Albumin: 4.2 g/dL (ref 3.5–5.0)
Alkaline Phosphatase: 90 U/L (ref 38–126)
Anion gap: 10 (ref 5–15)
BUN: 11 mg/dL (ref 8–23)
CO2: 26 mmol/L (ref 22–32)
Calcium: 9.6 mg/dL (ref 8.9–10.3)
Chloride: 100 mmol/L (ref 98–111)
Creatinine, Ser: 0.84 mg/dL (ref 0.61–1.24)
GFR calc Af Amer: >60 mL/min (ref >60)
GFR calc non Af Amer: >60 mL/min (ref >60)
Glucose, Bld: 230 mg/dL — ABNORMAL HIGH (ref 70–99)
Potassium: 3.9 mmol/L (ref 3.5–5.1)
Sodium: 136 mmol/L (ref 135–145)
Total Bilirubin: 0.8 mg/dL (ref 0.3–1.2)
Total Protein: 6.8 g/dL (ref 6.5–8.1)

## 2020-02-19 NOTE — Progress Notes (Signed)
NAME: Andrew York  DOB: 1955/07/30  MRN: [id number]  Date/Time: 02/19/2020 9:46 AM  Subjective:  Follow up visit  Last visit  Feb 2021  ? Andrew York is a 64 y.o. with a history of  HIV, HTN, DM  Is here for follow up. On triumeq and doing well- last Vl < 20 and CD4 >700 from feb 2021.  No new complaints Has received both doses of mRNA vaccine   100% Percent adherent to HAART.  HIV diagnosed 2011  Nadir Cd4>300 OI -shingles  HAARt history-atripla, triumeq Acquired thru sex with men Genotype-NA  PMH Migraine Renal stone HTN DM Tremor rt hand Nicotine addiction Statin myopathy   PSH Appendectomy cholecystectomy Renal stone removal/Ureteral stent Trigger finger release rt 08/22/15  SH Lives on his own  Smoker alcohol weekends Occasional marijuana Not sexually active in 62yrs Works at [employer]    FH Dm-mom -alive-90 yr old Father -deceased  Allergies  Allergen Reactions   Hydrocodone-Acetaminophen Itching   Oxycodone-Acetaminophen Itching   Ace Inhibitors Cough    Other reaction(s): Cough    Diclofenac Potassium(Migraine) Other (See Comments)   Hydrochlorothiazide Other (See Comments)    Gout Gout    Other Other (See Comments)   Statins     Other reaction(s): Muscle Pain   Sulfamethoxazole-Trimethoprim Other (See Comments)   ?allopurinol (ZYLOPRIM) 300 MG tablet, Take 300 mg by mouth daily., Disp: , Rfl:   ipratropium (ATROVENT HFA) inhaler, Inhale 2 inhalations into the lungs 4 (four) times daily as needed for Wheezing, Disp: 12.9 Inhaler, Rfl: 2  JARDIANCE 25 mg tablet, TAKE 1 TABLET BY MOUTH EVERY DAY, Disp: 90 tablet, Rfl: 1  lancets 33 gauge Misc, Use 1 Device 3 (three) times daily., Disp: 300 each, Rfl: 5  metFORMIN (GLUCOPHAGE-XR) 500 MG XR tablet, TAKE 4 TABLETS (2,000 MG TOTAL) BY MOUTH DAILY WITH DINNER, Disp: 360 tablet, Rfl: 11  ONETOUCH ULTRA TEST test strip, Use 3 (three) times daily. Dx e11.22, Disp:  300 each, Rfl: 3  propranoloL (INDERAL) 60 MG tablet, TAKE 1 TABLET (60 MG TOTAL) BY MOUTH 2 (TWO) TIMES DAILY., Disp: 180 tablet, Rfl: 3  rOPINIRole (REQUIP) 1 MG tablet, TAKE 1 TABLET BY MOUTH TWICE A DAY, Disp: 180 tablet, Rfl: 1  sildenafil, pulm.hypertension, (REVATIO) 20 mg tablet, 3-5 po tablets daily as needed, Disp: , Rfl:   tiotropium bromide (SPIRIVA RESPIMAT) 1.25 mcg/actuation inhalation spray, Inhale 2 inhalations (2.5 mcg total) into the lungs once daily, Disp: 4 g, Rfl: 11  TRIUMEQ 600-50-300 mg tablet, TAKE 1 TABLET BY MOUTH EVERY DAY, Disp: 30 tablet, Rfl: 11  venlafaxine (EFFEXOR-XR) 37.5 MG XR capsule, TAKE 1 CAPSULE BY MOUTH EVERY DAY, Disp: 90 capsule, Rfl: 1  dulaglutide (TRULICITY) 3 mg/0.5 mL pen injector, Inject 0.5 mLs (3 mg total) subcutaneously every 7 (seven) days, Disp: 6 mL, Rfl: 11  losartan (COZAAR) 100 MG tablet, Take 1 tablet (100 mg total) by mouth once daily, Disp: 90 tablet, Rfl: 11  Current Outpatient Medications  Medication Sig Dispense Refill   abacavir-dolutegravir-lamiVUDine (TRIUMEQ) 600-50-300 MG tablet Take 1 tablet by mouth daily. 30 tablet 6   empagliflozin (JARDIANCE) 25 MG TABS tablet Take 25 mg by mouth daily.     losartan (COZAAR) 50 MG tablet Take 50 mg by mouth daily.     metFORMIN (GLUCOPHAGE-XR) 500 MG 24 hr tablet Take 2,000 mg by mouth daily.     propranolol (INDERAL) 60 MG tablet Take 60 mg by mouth daily.  rOPINIRole (REQUIP) 1 MG tablet Take 1 mg by mouth daily.     tiotropium (SPIRIVA HANDIHALER) 18 MCG inhalation capsule Place 18 mcg into inhaler and inhale daily.     TRULICITY 0.75 MG/0.5ML SOPN Inject 0.75 pens as directed once a week.     venlafaxine XR (EFFEXOR-XR) 37.5 MG 24 hr capsule Take 1 capsule by mouth daily.     No current facility-administered medications for this visit.    REVIEW OF SYSTEMS:  Const: negative fever, negative chills, negative weight loss Eyes: negative diplopia or visual  changes, negative eye pain ENT: negative coryza, negative sore throat Resp: negative cough, hemoptysis, dyspnea Cards: negative for chest pain, palpitations, lower extremity edema GU: negative for frequency, dysuria and hematuria Skin: negative for rash and pruritus Heme: negative for easy bruising and gum/nose bleeding MS: negative for myalgias, arthralgias, back pain and muscle weakness Neurolo:negative for headaches, dizziness, vertigo, memory problems  Psych: negative for feelings of anxiety, depression   Objective:  VITALS:  BP (!) 167/102    Pulse (!) 101    Temp 97.9 F (36.6 C)    Resp 16    Ht 5\' 10"  (1.778 m)    Wt 197 lb 6.4 oz (89.5 kg)    SpO2 95%    BMI 28.32 kg/m PHYSICAL EXAM:  General: well Head: Normocephalic, without obvious abnormality, atraumatic. Eyes: Conjunctivae clear, anicteric sclerae. Pupils are equal Nose: Nares normal. No drainage or sinus tenderness. Throat: Lips, mucosa, and tongue normal. No Thrush Neck: Supple, symmetrical, no adenopathy, thyroid: non tender no carotid bruit and no JVD. Back: No CVA tenderness. Lungs: Clear to auscultation bilaterally. No Wheezing or Rhonchi. No rales. Heart: Regular rate and rhythm, no murmur, rub or gallop. Abdomen: Soft, non-tender,not distended. Bowel sounds normal. No masses Skin: No rashes or lesions. Not Jaundiced Lymph: Cervical, supraclavicular normal. Neurologic: Grossly non-focal Pertinent Labs As below  Health maintenance Vaccination  Vaccine Date last given comment  Influenza    Hepatitis B    Hepatitis A    Prevnar-PCV-13 07/06/16   Pneumovac-PPSV-23 08/31/16   TdaP 08/08/18   HPV    Shingrix ( zoster vaccine) 08/19/19 and sept 2020 2 doses received   ______________________  Labs Lab Result  Date comment  HIV VL <20 08/15/18   CD4 1111 08/15/18   Genotype     [id number]     HIV antibody     RPR NR 08/21/19   Quantiferon Gold NEG 08/15/18   Hep C ab NR 08/15/18   Hepatitis B-ab,ag,c Neg  08/15/18   Hepatitis A-IgM, IgG /T Neg 08/15/18   Lipid 120/281/25/38 12/2019   GC/CHL     PAP     HB,PLT,Cr, LFT Cr 0.9 08/15/18     Preventive  Procedure Result  Date comment  colonoscopy     Dental exam Uptodate NOV 2020   Opthal Uptodate      Impression/Recommendation ? ?HIV- on triumeq-100% adherent, last labs from Feb 2021reviewed- Vl < 20 and cd4 is 742   Health maintenance-updated Will need HEPB/vaccine- he wants to wait  DM-on JArdiance, trulicity, metformin - managed by his PCP-last Hba1c from Jun 2021 is 7.2- glimeparide stopped as trulicity was increased   HTN on losartan and propanalol  hypertriglycerdemia ? ?Nicotine dependence= pt is not ready or willing to quit  ? ___________________________________________________ Discussed with patient in detail Follow up 6 months

## 2020-02-19 NOTE — Patient Instructions (Addendum)
You are here for follow up. You are on Triumeq and doing well. Your triglycerides are high. Getting DM under control and losing weight will help

## 2020-02-20 LAB — HELPER T-LYMPH-CD4 (ARMC ONLY)
% CD 4 Pos. Lymph.: 49.1 % (ref 30.8–58.5)
Absolute CD 4 Helper: 786 /uL (ref 359–1519)
Basophils Absolute: 0 10*3/uL (ref 0.0–0.2)
Basos: 0 %
EOS (ABSOLUTE): 0.1 10*3/uL (ref 0.0–0.4)
Eos: 1 %
Hematocrit: 46.7 % (ref 37.5–51.0)
Hemoglobin: 16.6 g/dL (ref 13.0–17.7)
Immature Grans (Abs): 0 10*3/uL (ref 0.0–0.1)
Immature Granulocytes: 0 %
Lymphocytes Absolute: 1.6 10*3/uL (ref 0.7–3.1)
Lymphs: 17 %
MCH: 32.4 pg (ref 26.6–33.0)
MCHC: 35.5 g/dL (ref 31.5–35.7)
MCV: 91 fL (ref 79–97)
Monocytes Absolute: 0.6 10*3/uL (ref 0.1–0.9)
Monocytes: 7 %
Neutrophils Absolute: 7.1 10*3/uL — ABNORMAL HIGH (ref 1.4–7.0)
Neutrophils: 75 %
Platelets: 168 10*3/uL (ref 150–450)
RBC: 5.13 x10E6/uL (ref 4.14–5.80)
RDW: 14.5 % (ref 11.6–15.4)
WBC: 9.4 10*3/uL (ref 3.4–10.8)

## 2020-02-20 LAB — HIV-1 RNA QUANT-NO REFLEX-BLD
HIV 1 RNA Quant: 60 copies/mL
LOG10 HIV-1 RNA: 1.778 log10copy/mL

## 2020-02-22 LAB — QUANTIFERON-TB GOLD PLUS: QuantiFERON-TB Gold Plus: NEGATIVE

## 2020-02-22 LAB — QUANTIFERON-TB GOLD PLUS (RQFGPL)
QuantiFERON Mitogen Value: >10 IU/mL
QuantiFERON Nil Value: 0 IU/mL
QuantiFERON TB1 Ag Value: 0.02 IU/mL
QuantiFERON TB2 Ag Value: 0.05 IU/mL

## 2020-06-15 LAB — COLOGUARD: COLOGUARD: POSITIVE — AB

## 2020-08-26 ENCOUNTER — Other Ambulatory Visit: Payer: Self-pay

## 2020-08-26 ENCOUNTER — Encounter: Payer: Self-pay | Admitting: Infectious Diseases

## 2020-08-26 ENCOUNTER — Ambulatory Visit: Payer: Managed Care, Other (non HMO) | Attending: Infectious Diseases | Admitting: Infectious Diseases

## 2020-08-26 VITALS — BP 138/86 | HR 103 | Temp 98.0°F | Resp 16 | Ht 70.0 in | Wt 186.0 lb

## 2020-08-26 DIAGNOSIS — E119 Type 2 diabetes mellitus without complications: Secondary | ICD-10-CM | POA: Diagnosis not present

## 2020-08-26 DIAGNOSIS — B20 Human immunodeficiency virus [HIV] disease: Secondary | ICD-10-CM

## 2020-08-26 DIAGNOSIS — Z720 Tobacco use: Secondary | ICD-10-CM | POA: Diagnosis not present

## 2020-08-26 DIAGNOSIS — I10 Essential (primary) hypertension: Secondary | ICD-10-CM | POA: Diagnosis not present

## 2020-08-26 MED ORDER — ABACAVIR-DOLUTEGRAVIR-LAMIVUD 600-50-300 MG PO TABS: 1.0000 | ORAL_TABLET | Freq: Every day | ORAL | 6 refills | Status: DC

## 2020-08-26 NOTE — Progress Notes (Signed)
NAME: Andrew York  DOB: October 01, 1955  MRN: [id number]  Date/Time: 08/26/2020 10:00 AM  Subjective:  Follow up visit  Last visit  Feb 19 2020  ? Andrew York is a 65 y.o. with a history of  HIV, HTN, DM Since his last visit, he saw his PCP and was started Topiramate for migraine Has lost 11 more pounds since his last visit.says it is due to trulicity which was started 2 years ago  and he has dropped 30 pounds since then. cologuard was positive and he is scheduled ot have colonoscopy soon    On triumeq and doing well- last Vl 60 and CD4 786 from aug 2021.  No new complaints t a tree fell on the back of his house and there is a tarp Has received 3 doses of mRNA vaccine   100% Percent adherent to HAART.  HIV diagnosed 2011  Nadir Cd4>300 OI -shingles  HAARt history-atripla, triumeq Acquired thru sex with men Genotype-NA  PMH Migraine Renal stone HTN DM Tremor rt hand Nicotine addiction Statin myopathy   PSH Appendectomy cholecystectomy Renal stone removal/Ureteral stent Trigger finger release rt 08/22/15  SH Lives on his own  Smoker alcohol weekends Occasional marijuana Not sexually active in 53yrs Works at [employer]    FH Dm-mom -alive-92 yr old lives in [place] Father -deceased  Allergies  Allergen Reactions  . Hydrocodone-Acetaminophen Itching  . Oxycodone-Acetaminophen Itching  . Ace Inhibitors Cough    Other reaction(s): Cough   . Diclofenac Potassium(Migraine) Other (See Comments)  . Hydrochlorothiazide Other (See Comments)    Gout Gout   . Other Other (See Comments)  . Statins     Other reaction(s): Muscle Pain  . Sulfamethoxazole-Trimethoprim Other (See Comments)     Current Outpatient Medications  Medication Sig Dispense Refill  . abacavir-dolutegravir-lamiVUDine (TRIUMEQ) 600-50-300 MG tablet Take 1 tablet by mouth daily. 30 tablet 6  . empagliflozin (JARDIANCE) 25 MG TABS tablet Take 25 mg by mouth daily.    Marland Kitchen  losartan (COZAAR) 50 MG tablet Take 50 mg by mouth daily.    Marland Kitchen rOPINIRole (REQUIP) 1 MG tablet Take 1 mg by mouth daily.    Marland Kitchen tiotropium (SPIRIVA HANDIHALER) 18 MCG inhalation capsule Place 18 mcg into inhaler and inhale daily.    Marland Kitchen topiramate (TOPAMAX) 25 MG tablet Take by mouth.    . TRULICITY 0.75 MG/0.5ML SOPN Inject 0.75 pens as directed once a week.    . metFORMIN (GLUCOPHAGE-XR) 500 MG 24 hr tablet Take 2,000 mg by mouth daily.    . propranolol (INDERAL) 60 MG tablet Take 60 mg by mouth daily.    Marland Kitchen venlafaxine XR (EFFEXOR-XR) 37.5 MG 24 hr capsule Take 1 capsule by mouth daily.     No current facility-administered medications for this visit.    REVIEW OF SYSTEMS:  Const: negative fever, negative chills, negative weight loss Eyes: negative diplopia or visual changes, negative eye pain ENT: negative coryza, negative sore throat Resp: negative cough, hemoptysis, dyspnea Cards: negative for chest pain, palpitations, lower extremity edema GU: negative for frequency, dysuria and hematuria Skin: negative for rash and pruritus Heme: negative for easy bruising and gum/nose bleeding MS: negative for myalgias, arthralgias, back pain and muscle weakness Neurolo:negative for headaches, dizziness, vertigo, memory problems  Psych: negative for feelings of anxiety, depression   Objective:  VITALS:  BP 138/86   Pulse (!) 103   Temp 98 F (36.7 C)   Resp 16   Ht 5\' 10"  (1.778  m)   Wt 186 lb (84.4 kg)   SpO2 99%   BMI 26.69 kg/m PHYSICAL EXAM:  General: alert, no distress, well Head: Normocephalic, without obvious abnormality, atraumatic. Eyes:Pupils equal and reacting to light Nose: Nares normal. No drainage or sinus tenderness. Throat: Lips, mucosa, and tongue normal. No Thrush Good dentition Neck: Supple, symmetrical, no adenopathy, thyroid: non tender no carotid bruit and no JVD. Back: No CVA tenderness. Lungs: Clear to auscultation bilaterally. No Wheezing or Rhonchi. No  rales. Heart: Regular rate and rhythm, no murmur, rub or gallop. Abdomen: Soft, non-tender,not distended. Bowel sounds normal. No masses Skin: No rashes or lesions. Not Jaundiced Lymph: Cervical, supraclavicular normal. Neurologic: Grossly non-focal Pertinent Labs As below  Health maintenance Vaccination  Vaccine Date last given comment  Influenza    Hepatitis B    Hepatitis A    Prevnar-PCV-13 07/06/16   Pneumovac-PPSV-23 08/31/16   TdaP 08/08/18   HPV    Shingrix ( zoster vaccine) 08/19/19 and sept 2020 2 doses received   ______________________  Labs Lab Result  Date comment  HIV VL <20 08/15/18   CD4 786 ( 49%) 02/19/20   Genotype     [id number]     HIV antibody     RPR NR 08/21/19   Quantiferon Gold NEG 02/19/20   Hep C ab NR 08/15/18   Hepatitis B-ab,ag,c Neg 08/15/18   Hepatitis A-IgM, IgG /T Neg 08/15/18   Lipid 120/281/25/38 12/2019   GC/CHL     PAP     HB,PLT,Cr, LFT Cr 0.9 08/15/18     Preventive  Procedure Result  Date comment  colonoscopy     Dental exam Uptodate NOV 2020   Opthal Uptodate      Impression/Recommendation ? ?HIV- on triumeq-100% adherent, last labs from Aug  2021 Vl 60 and cd4 is 786   Will need HEPB/vaccine- he wants to wait  DM-on JArdiance, trulicity, metformin - managed by his PCP-last Hba1c  is 7.6-   HTN on losartan and propanalol  hypertriglycerdemia ? Nicotine dependence= pt not ready to quit  ? ___________________________________________________ Discussed with patient in detail Follow up 6 months when labs will be done as well

## 2020-10-14 ENCOUNTER — Emergency Department
Admission: EM | Admit: 2020-10-14 | Discharge: 2020-10-14 | Disposition: A | Discharge status: Home / Self Care | Payer: Managed Care, Other (non HMO) | Attending: Emergency Medicine | Admitting: Emergency Medicine

## 2020-10-14 ENCOUNTER — Emergency Department: Payer: Managed Care, Other (non HMO)

## 2020-10-14 ENCOUNTER — Encounter: Payer: Self-pay | Admitting: Emergency Medicine

## 2020-10-14 ENCOUNTER — Other Ambulatory Visit: Payer: Self-pay

## 2020-10-14 DIAGNOSIS — Z21 Asymptomatic human immunodeficiency virus [HIV] infection status: Secondary | ICD-10-CM | POA: Diagnosis not present

## 2020-10-14 DIAGNOSIS — R000 Tachycardia, unspecified: Secondary | ICD-10-CM

## 2020-10-14 DIAGNOSIS — E119 Type 2 diabetes mellitus without complications: Secondary | ICD-10-CM | POA: Insufficient documentation

## 2020-10-14 DIAGNOSIS — I10 Essential (primary) hypertension: Secondary | ICD-10-CM | POA: Insufficient documentation

## 2020-10-14 DIAGNOSIS — Z7984 Long term (current) use of oral hypoglycemic drugs: Secondary | ICD-10-CM | POA: Diagnosis not present

## 2020-10-14 DIAGNOSIS — F17210 Nicotine dependence, cigarettes, uncomplicated: Secondary | ICD-10-CM | POA: Insufficient documentation

## 2020-10-14 DIAGNOSIS — Z79899 Other long term (current) drug therapy: Secondary | ICD-10-CM | POA: Diagnosis not present

## 2020-10-14 LAB — CBC
HCT: 46.1 % (ref 39.0–52.0)
Hemoglobin: 16.2 g/dL (ref 13.0–17.0)
MCH: 31.5 pg (ref 26.0–34.0)
MCHC: 35.1 g/dL (ref 30.0–36.0)
MCV: 89.5 fL (ref 80.0–100.0)
Platelets: 152 10*3/uL (ref 150–400)
RBC: 5.15 MIL/uL (ref 4.22–5.81)
RDW: 13.5 % (ref 11.5–15.5)
WBC: 10 10*3/uL (ref 4.0–10.5)
nRBC: 0 % (ref 0.0–0.2)

## 2020-10-14 LAB — COMPREHENSIVE METABOLIC PANEL
ALT: 27 U/L (ref 0–44)
AST: 38 U/L (ref 15–41)
Albumin: 4.3 g/dL (ref 3.5–5.0)
Alkaline Phosphatase: 92 U/L (ref 38–126)
Anion gap: 13 (ref 5–15)
BUN: 17 mg/dL (ref 8–23)
CO2: 17 mmol/L — ABNORMAL LOW (ref 22–32)
Calcium: 8.6 mg/dL — ABNORMAL LOW (ref 8.9–10.3)
Chloride: 108 mmol/L (ref 98–111)
Creatinine, Ser: 0.89 mg/dL (ref 0.61–1.24)
GFR, Estimated: >60 mL/min (ref >60)
Glucose, Bld: 209 mg/dL — ABNORMAL HIGH (ref 70–99)
Potassium: 3.3 mmol/L — ABNORMAL LOW (ref 3.5–5.1)
Sodium: 138 mmol/L (ref 135–145)
Total Bilirubin: 0.7 mg/dL (ref 0.3–1.2)
Total Protein: 7.3 g/dL (ref 6.5–8.1)

## 2020-10-14 LAB — TROPONIN I (HIGH SENSITIVITY): Troponin I (High Sensitivity): 7 ng/L (ref <18)

## 2020-10-14 LAB — MAGNESIUM: Magnesium: 2.4 mg/dL (ref 1.7–2.4)

## 2020-10-14 LAB — TSH: TSH: 2.199 u[IU]/mL (ref 0.350–4.500)

## 2020-10-14 NOTE — ED Triage Notes (Signed)
Pt to ED via wheelchair from [facility], pt states was doing a stress test for his heart. Per Provider from [facility] pt had of SVT but "was not recovering". Pt denies CP at this time. Able to speak in full and complete sentences at this time.

## 2020-10-14 NOTE — ED Provider Notes (Signed)
[facility] Emergency Department Provider Note   ____________________________________________   Event Date/Time   First MD Initiated Contact with Patient 10/14/20 1503     (approximate)  I have reviewed the triage vital signs and the nursing notes.   HISTORY  Chief Complaint Tachycardia    HPI Andrew York is a 65 y.o. male with a stated past medical history of type 2 diabetes, HIV, and hypertension who presents from [place] [facility] after a stress test for tachycardia.  Patient states that [facility] clinic told him he had 1 minute of SVT but was "not recovering".  Patient denies any complaints at this time.  Patient denies any chest pain, palpitations, shortness of breath, dyspnea on exertion, edema of the extremities, or cyanosis         Past Medical History:  Diagnosis Date  . DM (diabetes mellitus) (HCC) 08/15/2018  . HIV disease (HCC) 08/15/2018  . HTN (hypertension) 08/15/2018    Patient Active Problem List   Diagnosis Date Noted  . HIV disease (HCC) 08/15/2018  . DM (diabetes mellitus) (HCC) 08/15/2018  . HTN (hypertension) 08/15/2018    History reviewed. No pertinent surgical history.  Prior to Admission medications   Medication Sig Start Date End Date Taking? Authorizing Provider  abacavir-dolutegravir-lamiVUDine (TRIUMEQ) 600-50-300 MG tablet Take 1 tablet by mouth daily. 08/26/20   Lynn Ito, MD  empagliflozin (JARDIANCE) 25 MG TABS tablet Take 25 mg by mouth daily. 06/02/18   [provider]  losartan (COZAAR) 50 MG tablet Take 50 mg by mouth daily. 03/25/15   [provider]  metFORMIN (GLUCOPHAGE-XR) 500 MG 24 hr tablet Take 2,000 mg by mouth daily. 08/08/18 02/19/20  [provider]  propranolol (INDERAL) 60 MG tablet Take 60 mg by mouth daily. 06/01/18 02/19/20  [provider]  rOPINIRole (REQUIP) 1 MG tablet Take 1 mg by mouth daily. 11/08/17   [provider]  tiotropium  (SPIRIVA HANDIHALER) 18 MCG inhalation capsule Place 18 mcg into inhaler and inhale daily.    [provider]  topiramate (TOPAMAX) 25 MG tablet Take by mouth. 05/20/20 05/20/21  [provider]  TRULICITY 0.75 MG/0.5ML SOPN Inject 0.75 pens as directed once a week. 07/20/18   [provider]  venlafaxine XR (EFFEXOR-XR) 37.5 MG 24 hr capsule Take 1 capsule by mouth daily. 08/02/18 02/19/20  [provider]    Allergies Hydrocodone-acetaminophen, Oxycodone-acetaminophen, Ace inhibitors, Diclofenac potassium(migraine), Hydrochlorothiazide, Other, Statins, and Sulfamethoxazole-trimethoprim  History reviewed. No pertinent family history.  Social History Social History   Tobacco Use  . Smoking status: Current Every Day Smoker    Packs/day: 1.25    Years: 41.00    Pack years: 51.25    Types: Cigarettes    Start date: 07/19/1976  . Smokeless tobacco: Never Used  Vaping Use  . Vaping Use: Never used  Substance Use Topics  . Alcohol use: Not Currently  . Drug use: Never    Review of Systems Constitutional: No fever/chills Eyes: No visual changes. ENT: No sore throat. Cardiovascular: Denies chest pain. Respiratory: Denies shortness of breath. Gastrointestinal: No abdominal pain.  No nausea, no vomiting.  No diarrhea. Genitourinary: Negative for dysuria. Musculoskeletal: Negative for acute arthralgias Skin: Negative for rash. Neurological: Negative for headaches, weakness/numbness/paresthesias in any extremity Psychiatric: Negative for suicidal ideation/homicidal ideation   ____________________________________________   PHYSICAL EXAM:  VITAL SIGNS: ED Triage Vitals  Enc Vitals Group     BP 10/14/20 1359 (!) 150/80     Pulse Rate  10/14/20 1359 (!) 124     Resp 10/14/20 1359 18     Temp 10/14/20 1359 98.7 F (37.1 C)     Temp Source 10/14/20 1359 Oral     SpO2 10/14/20 1359 96 %     Weight 10/14/20 1354 190 lb (86.2 kg)     Height 10/14/20  1354 5\' 10"  (1.778 m)     Head Circumference --      Peak Flow --      Pain Score 10/14/20 1354 0     Pain Loc --      Pain Edu? --      Excl. in GC? --    Constitutional: Alert and oriented. Well appearing and in no acute distress. Eyes: Conjunctivae are normal. PERRL. Head: Atraumatic. Nose: No congestion/rhinnorhea. Mouth/Throat: Mucous membranes are moist. Neck: No stridor Cardiovascular: Grossly normal heart sounds.  Good peripheral circulation. Respiratory: Normal respiratory effort.  No retractions. Gastrointestinal: Soft and nontender. No distention. Musculoskeletal: No obvious deformities Neurologic:  Normal speech and language. No gross focal neurologic deficits are appreciated. Skin:  Skin is warm and dry. No rash noted. Psychiatric: Mood and affect are normal. Speech and behavior are normal.  ____________________________________________   LABS (all labs ordered are listed, but only abnormal results are displayed)  Labs Reviewed  COMPREHENSIVE METABOLIC PANEL - Abnormal; Notable for the following components:      Result Value   Potassium 3.3 (*)    CO2 17 (*)    Glucose, Bld 209 (*)    Calcium 8.6 (*)    All other components within normal limits  CBC  MAGNESIUM  TSH  TROPONIN I (HIGH SENSITIVITY)  TROPONIN I (HIGH SENSITIVITY)   ____________________________________________  EKG  ED ECG REPORT I, 10/16/20, the attending physician, personally viewed and interpreted this ECG.  Date: 10/14/2020 EKG Time: 1356 Rate: 126 Rhythm: Tachycardic sinus rhythm QRS Axis: normal Intervals: normal ST/T Wave abnormalities: normal Narrative Interpretation: no evidence of acute ischemia  ____________________________________________  RADIOLOGY  ED MD interpretation: 2 view chest x-ray shows no evidence of acute abnormalities including no pneumonia, pneumothorax, or widened mediastinum  Official radiology report(s): DG Chest 2 View  Result Date:  10/14/2020 CLINICAL DATA:  Short of breath, SVT EXAM: CHEST - 2 VIEW COMPARISON:  09/23/2011 FINDINGS: The heart size and mediastinal contours are within normal limits. Both lungs are clear. The visualized skeletal structures are unremarkable. IMPRESSION: No active cardiopulmonary disease. Electronically Signed   By: 11/23/2011 M.D.   On: 10/14/2020 15:15    ____________________________________________   PROCEDURES  Procedure(s) performed (including Critical Care):  .1-3 Lead EKG Interpretation Performed by: 10/16/2020, MD Authorized by: Merwyn Katos, MD     Interpretation: abnormal     ECG rate:  109   ECG rate assessment: tachycardic     Rhythm: sinus tachycardia     Ectopy: none     Conduction: normal       ____________________________________________   INITIAL IMPRESSION / ASSESSMENT AND PLAN / ED COURSE  As part of my medical decision making, I reviewed the following data within the electronic MEDICAL RECORD NUMBER Nursing notes reviewed and incorporated, Labs reviewed, EKG interpreted, Old chart reviewed, Radiograph reviewed and Notes from prior ED visits reviewed and incorporated      The patient is suffering from tachycardia, but the immediate cause is not apparent.  Potential causes considered include, but are not limited to, infection, hyperthyroidism, pulmonary embolism, pericarditis, dehydration, anemia, pheochromocytoma, drug/alcohol  withdrawal or intoxication.  Workup: CBC, BMP, LFT, TSH, lactate, UA, UDS, CXR, ECG, d-dimer Interventions: 3L 0.9% NS IVF  Despite the evaluation including history, exam, and testing, the cause of the tachycardia remains unclear. However the history, exam, and tests do not raise concern for the above emergent diagnoses.  Disposition: During the ED stay, patient's tachycardia improved, and at the time of discharge they are feeling well and want to go home.      ____________________________________________   FINAL  CLINICAL IMPRESSION(S) / ED DIAGNOSES  Final diagnoses:  Tachycardia     ED Discharge Orders    None       Note:  This document was prepared using Dragon voice recognition software and may include unintentional dictation errors.   Merwyn Katos, MD 10/14/20 2038

## 2021-01-15 ENCOUNTER — Other Ambulatory Visit (HOSPITAL_COMMUNITY): Payer: Self-pay

## 2021-02-24 ENCOUNTER — Other Ambulatory Visit
Admission: RE | Admit: 2021-02-24 | Discharge: 2021-02-24 | Disposition: A | Discharge status: Home / Self Care | Payer: Managed Care, Other (non HMO) | Attending: Infectious Diseases | Admitting: Infectious Diseases

## 2021-02-24 ENCOUNTER — Ambulatory Visit: Payer: Managed Care, Other (non HMO) | Attending: Infectious Diseases | Admitting: Infectious Diseases

## 2021-02-24 ENCOUNTER — Other Ambulatory Visit: Payer: Self-pay

## 2021-02-24 VITALS — BP 151/78 | HR 96 | Resp 16 | Ht 70.0 in | Wt 184.0 lb

## 2021-02-24 DIAGNOSIS — F17200 Nicotine dependence, unspecified, uncomplicated: Secondary | ICD-10-CM | POA: Diagnosis not present

## 2021-02-24 DIAGNOSIS — Z7901 Long term (current) use of anticoagulants: Secondary | ICD-10-CM | POA: Diagnosis not present

## 2021-02-24 DIAGNOSIS — Z885 Allergy status to narcotic agent status: Secondary | ICD-10-CM | POA: Diagnosis not present

## 2021-02-24 DIAGNOSIS — I471 Supraventricular tachycardia: Secondary | ICD-10-CM | POA: Insufficient documentation

## 2021-02-24 DIAGNOSIS — I10 Essential (primary) hypertension: Secondary | ICD-10-CM | POA: Insufficient documentation

## 2021-02-24 DIAGNOSIS — B20 Human immunodeficiency virus [HIV] disease: Secondary | ICD-10-CM | POA: Insufficient documentation

## 2021-02-24 DIAGNOSIS — G43909 Migraine, unspecified, not intractable, without status migrainosus: Secondary | ICD-10-CM | POA: Insufficient documentation

## 2021-02-24 DIAGNOSIS — Z7984 Long term (current) use of oral hypoglycemic drugs: Secondary | ICD-10-CM | POA: Diagnosis not present

## 2021-02-24 DIAGNOSIS — Z794 Long term (current) use of insulin: Secondary | ICD-10-CM | POA: Diagnosis not present

## 2021-02-24 DIAGNOSIS — Z79899 Other long term (current) drug therapy: Secondary | ICD-10-CM | POA: Insufficient documentation

## 2021-02-24 DIAGNOSIS — E119 Type 2 diabetes mellitus without complications: Secondary | ICD-10-CM | POA: Diagnosis not present

## 2021-02-24 NOTE — Patient Instructions (Signed)
You are here for follow up- today will do labs and will see you back in 6 months

## 2021-02-24 NOTE — Progress Notes (Signed)
NAME: Andrew York  DOB: 04/28/1956  MRN: [id number]  Date/Time: 02/24/2021 9:37 AM  Subjective:  Follow up visit  Last visit  feb 2022  Since last visit his PCP wanted him to have stress test so that he could exercise- had SVT, wa sin ED for 3 hours- He saw cardiologist Dr.paraschos, and had holter monitor- propanolol increased to 80mg  BID Colonoscopy /endoscopy end of the month ? DERALD LORGE is a 65 y.o. with a history of  HIV, HTN, DM, SVT recently diagnosed  Following taken from th elast visit notes  On triumeq and doing well- last Vl 60 and CD4 786 from aug 2021. Has received 3 doses of mRNA vaccine   100% Percent adherent to HAART.  HIV diagnosed 2011  Nadir Cd4>300 OI -shingles  HAARt history-atripla, triumeq Acquired thru sex with men Genotype-NA  PMH SVT Migraine Renal stone HTN DM Tremor rt hand Nicotine addiction Statin myopathy   PSH Appendectomy cholecystectomy Renal stone removal/Ureteral stent Trigger finger release rt 08/22/15   SH Lives on his own  Smoker alcohol weekends Occasional marijuana Not sexually active in 50yrs Works at [employer]      FH Dm-mom -alive-92 yr old lives in [place] Father -deceased  Allergies  Allergen Reactions   Hydrocodone-Acetaminophen Itching   Oxycodone-Acetaminophen Itching   Ace Inhibitors Cough    Other reaction(s): Cough    Diclofenac Potassium(Migraine) Other (See Comments)   Hydrochlorothiazide Other (See Comments)    Gout Gout    Other Other (See Comments)   Statins     Other reaction(s): Muscle Pain   Sulfamethoxazole-Trimethoprim Other (See Comments)     Current Outpatient Medications  Medication Sig Dispense Refill   abacavir-dolutegravir-lamiVUDine (TRIUMEQ) 600-50-300 MG tablet Take 1 tablet by mouth daily. 30 tablet 6   empagliflozin (JARDIANCE) 25 MG TABS tablet Take 25 mg by mouth daily.     losartan (COZAAR) 100 MG tablet Take 100 mg by mouth daily.     propranolol  (INDERAL) 80 MG tablet Take 80 mg by mouth 3 (three) times daily.     rOPINIRole (REQUIP) 1 MG tablet Take 1 mg by mouth daily.     tiotropium (SPIRIVA HANDIHALER) 18 MCG inhalation capsule Place 18 mcg into inhaler and inhale daily.     topiramate (TOPAMAX) 25 MG tablet Take by mouth.     TRULICITY 0.75 MG/0.5ML SOPN Inject 0.75 pens as directed once a week.     venlafaxine XR (EFFEXOR-XR) 37.5 MG 24 hr capsule Take 1 capsule by mouth daily.     No current facility-administered medications for this visit.    REVIEW OF SYSTEMS:  Const: negative fever, negative chills, negative weight loss Eyes: negative diplopia or visual changes, negative eye pain ENT: negative coryza, negative sore throat Resp: negative cough, hemoptysis, dyspnea Cards: palpitations, occasional dizziness GU: negative for frequency, dysuria and hematuria Skin: negative for rash and pruritus Heme: negative for easy bruising and gum/nose bleeding MS: negative for myalgias, arthralgias, back pain and muscle weakness Neurolo:negative for headaches, dizziness, vertigo, memory problems  Psych: negative for feelings of anxiety, depression   Objective:  VITALS:  BP (!) 151/78   Pulse 96   Resp 16   Ht 5\' 10"  (1.778 m)   Wt 184 lb (83.5 kg)   SpO2 96%   BMI 26.40 kg/m PHYSICAL EXAM:  General: looks well Head: Normocephalic, without obvious abnormality, atraumatic. Eyes:Pupils equal and reacting to light Nose: Nares normal. No drainage or sinus tenderness. Throat: Lips,  mucosa, and tongue normal. No Thrush Good dentition Neck: Supple, symmetrical, no adenopathy, thyroid: non tender no carotid bruit and no JVD. Back: No CVA tenderness. Lungs: Clear to auscultation bilaterally. No Wheezing or Rhonchi. No rales. Heart: Regular rate and rhythm, no murmur, rub or gallop.tachycardia Abdomen: Soft, non-tender,not distended. Bowel sounds normal. No masses Skin: No rashes or lesions. Not Jaundiced Lymph: Cervical,  supraclavicular normal. Neurologic: Grossly non-focal Pertinent Labs As below  Health maintenance Vaccination  Vaccine Date last given comment  Influenza    Hepatitis B    Hepatitis A    Prevnar-PCV-13 07/06/16   Pneumovac-PPSV-23 08/31/16   TdaP 08/08/18   HPV    Shingrix ( zoster vaccine) 08/19/19 and sept 2020 2 doses received   ______________________  Labs Lab Result  Date comment  HIV VL <20 08/15/18   CD4 786 ( 49%) 02/19/20   Genotype     [id number]     HIV antibody     RPR NR 08/21/19   Quantiferon Gold NEG 02/19/20   Hep C ab NR 08/15/18   Hepatitis B-ab,ag,c Neg 08/15/18   Hepatitis A-IgM, IgG /T Neg 08/15/18   Lipid 120/281/25/38 12/2019   GC/CHL     PAP     HB,PLT,Cr, LFT Cr 0.9 08/15/18     Preventive  Procedure Result  Date comment  colonoscopy     Dental exam Uptodate NOV 2020   Opthal Uptodate      Impression/Recommendation ? 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 Nicotine dependence= pt not ready to quit   ?Discussed about monkey POX- no risk factors currently ___________________________________________________ Discussed with patient in detail Follow up 6 months - labs today

## 2021-03-16 ENCOUNTER — Encounter: Admission: RE | Payer: Self-pay | Source: Home / Self Care

## 2021-03-16 ENCOUNTER — Ambulatory Visit: Admission: RE | Admit: 2021-03-16 | Payer: Managed Care, Other (non HMO) | Source: Home / Self Care

## 2021-03-16 LAB — MISC LABCORP TEST (SEND OUT): Labcorp test code: 551776

## 2021-03-16 SURGERY — COLONOSCOPY WITH PROPOFOL
Anesthesia: General

## 2021-08-04 ENCOUNTER — Other Ambulatory Visit: Payer: Self-pay | Admitting: *Deleted

## 2021-08-04 DIAGNOSIS — F17210 Nicotine dependence, cigarettes, uncomplicated: Secondary | ICD-10-CM

## 2021-08-04 DIAGNOSIS — Z87891 Personal history of nicotine dependence: Secondary | ICD-10-CM

## 2021-08-18 ENCOUNTER — Ambulatory Visit (INDEPENDENT_AMBULATORY_CARE_PROVIDER_SITE_OTHER)
Admission: RE | Admit: 2021-08-18 | Discharge: 2021-08-18 | Disposition: A | Discharge status: Home / Self Care | Payer: Managed Care, Other (non HMO) | Source: Ambulatory Visit | Attending: Acute Care | Admitting: Acute Care

## 2021-08-18 ENCOUNTER — Other Ambulatory Visit: Payer: Self-pay

## 2021-08-18 ENCOUNTER — Ambulatory Visit: Payer: Managed Care, Other (non HMO)

## 2021-08-18 DIAGNOSIS — F17210 Nicotine dependence, cigarettes, uncomplicated: Secondary | ICD-10-CM | POA: Diagnosis not present

## 2021-08-18 DIAGNOSIS — Z87891 Personal history of nicotine dependence: Secondary | ICD-10-CM | POA: Diagnosis not present

## 2021-08-20 ENCOUNTER — Other Ambulatory Visit: Payer: Self-pay | Admitting: Acute Care

## 2021-08-20 DIAGNOSIS — F17210 Nicotine dependence, cigarettes, uncomplicated: Secondary | ICD-10-CM

## 2021-08-20 DIAGNOSIS — Z87891 Personal history of nicotine dependence: Secondary | ICD-10-CM

## 2021-08-25 ENCOUNTER — Other Ambulatory Visit: Payer: Self-pay | Admitting: Infectious Diseases

## 2021-08-25 ENCOUNTER — Other Ambulatory Visit: Payer: Self-pay

## 2021-08-25 ENCOUNTER — Ambulatory Visit: Payer: Managed Care, Other (non HMO) | Attending: Infectious Diseases | Admitting: Infectious Diseases

## 2021-08-25 ENCOUNTER — Encounter: Payer: Self-pay | Admitting: Infectious Diseases

## 2021-08-25 VITALS — BP 156/90 | HR 91 | Temp 97.8°F | Wt 190.0 lb

## 2021-08-25 DIAGNOSIS — Z7984 Long term (current) use of oral hypoglycemic drugs: Secondary | ICD-10-CM | POA: Insufficient documentation

## 2021-08-25 DIAGNOSIS — I471 Supraventricular tachycardia: Secondary | ICD-10-CM | POA: Insufficient documentation

## 2021-08-25 DIAGNOSIS — F17200 Nicotine dependence, unspecified, uncomplicated: Secondary | ICD-10-CM | POA: Diagnosis not present

## 2021-08-25 DIAGNOSIS — Z720 Tobacco use: Secondary | ICD-10-CM

## 2021-08-25 DIAGNOSIS — I10 Essential (primary) hypertension: Secondary | ICD-10-CM | POA: Diagnosis not present

## 2021-08-25 DIAGNOSIS — E119 Type 2 diabetes mellitus without complications: Secondary | ICD-10-CM | POA: Diagnosis not present

## 2021-08-25 DIAGNOSIS — E781 Pure hyperglyceridemia: Secondary | ICD-10-CM | POA: Diagnosis not present

## 2021-08-25 DIAGNOSIS — Z79899 Other long term (current) drug therapy: Secondary | ICD-10-CM | POA: Insufficient documentation

## 2021-08-25 DIAGNOSIS — B20 Human immunodeficiency virus [HIV] disease: Secondary | ICD-10-CM | POA: Diagnosis not present

## 2021-08-25 DIAGNOSIS — G43909 Migraine, unspecified, not intractable, without status migrainosus: Secondary | ICD-10-CM | POA: Diagnosis not present

## 2021-08-25 DIAGNOSIS — Z21 Asymptomatic human immunodeficiency virus [HIV] infection status: Secondary | ICD-10-CM | POA: Insufficient documentation

## 2021-08-25 MED ORDER — DOVATO 50-300 MG PO TABS: 1.0000 | ORAL_TABLET | Freq: Every day | ORAL | 3 refills | Status: DC

## 2021-08-25 NOTE — Patient Instructions (Addendum)
You are here for follow up- today will check VL and cd4 and change you from triumeq to dovato.  [facility]

## 2021-08-25 NOTE — Progress Notes (Signed)
NAME: Andrew York  DOB: 17-Aug-1955  MRN: [id number]  Date/Time: 08/25/2021 10:21 AM  Subjective:  Follow up visit Patient is doing well On Triumeq and 100% adherent Saw his PCP on 08/11/2021 and had some labs done. Last visit  aug 2022 ? Andrew York is a 66 y.o. with a history of  HIV, HTN, DM, SVT recently diagnosed  On triumeq and doing well- last Vl 60 and CD4 786 from aug 2021. Has received 3 doses of mRNA vaccine   100% Percent adherent to HAART.  HIV diagnosed 2011  Nadir Cd4>300 OI -shingles  HAARt history-atripla, triumeq Acquired thru sex with men Genotype-NA  PMH SVT Migraine Renal stone HTN DM Tremor rt hand Nicotine addiction Statin myopathy   PSH Appendectomy cholecystectomy Renal stone removal/Ureteral stent Trigger finger release rt 08/22/15   SH Lives on his own  Smoker alcohol weekends Occasional marijuana Not sexually active in 79yrs Works at [employer]      [place] Dm-mom -alive-92 yr old lives in [place] Father -deceased  Allergies  Allergen Reactions   Hydrocodone-Acetaminophen Itching   Oxycodone-Acetaminophen Itching   Ace Inhibitors Cough    Other reaction(s): Cough    Diclofenac Potassium(Migraine) Other (See Comments)   Hydrochlorothiazide Other (See Comments)    Gout Gout    Other Other (See Comments)   Statins     Other reaction(s): Muscle Pain   Sulfamethoxazole-Trimethoprim Other (See Comments)     Current Outpatient Medications  Medication Sig Dispense Refill   abacavir-dolutegravir-lamiVUDine (TRIUMEQ) 600-50-300 MG tablet Take 1 tablet by mouth daily. 30 tablet 6   empagliflozin (JARDIANCE) 25 MG TABS tablet Take 25 mg by mouth daily.     losartan (COZAAR) 100 MG tablet Take 100 mg by mouth daily.     pantoprazole (PROTONIX) 40 MG tablet Take 40 mg by mouth daily.     propranolol (INDERAL) 80 MG tablet Take 80 mg by mouth 3 (three) times daily.     rOPINIRole (REQUIP) 1 MG tablet Take 1 mg by mouth  daily.     tiotropium (SPIRIVA HANDIHALER) 18 MCG inhalation capsule Place 18 mcg into inhaler and inhale daily.     TRULICITY [id number] SOPN Inject 0.75 pens as directed once a week.     topiramate (TOPAMAX) 25 MG tablet Take by mouth.     venlafaxine XR (EFFEXOR-XR) 37.5 MG 24 hr capsule Take 1 capsule by mouth daily.     No current facility-administered medications for this visit.    REVIEW OF SYSTEMS:  Const: negative fever, negative chills, negative weight loss Eyes: negative diplopia or visual changes, negative eye pain ENT: negative coryza, negative sore throat Resp: negative cough, hemoptysis, dyspnea Cards: palpitations, occasional dizziness GU: negative for frequency, dysuria and hematuria Skin: negative for rash and pruritus Heme: negative for easy bruising and gum/nose bleeding MS: negative for myalgias, arthralgias, back pain and muscle weakness Neurolo:negative for headaches, dizziness, vertigo, memory problems  Psych: negative for feelings of anxiety, depression   Objective:  VITALS:  BP (!) 156/90    Pulse 91    Temp 97.8 F (36.6 C) (Oral)    Wt 190 lb (86.2 kg)    BMI 27.26 kg/m PHYSICAL EXAM:  General: looks well Head: Normocephalic, without obvious abnormality, atraumatic. Eyes:Pupils equal and reacting to light Nose: Nares normal. No drainage or sinus tenderness. Throat: Lips, mucosa, and tongue normal. No Thrush Good dentition Neck: Supple, symmetrical, no adenopathy, thyroid: non tender no carotid bruit and no JVD.  Back: No CVA tenderness. Lungs: Clear to auscultation bilaterally. No Wheezing or Rhonchi. No rales. Heart: Regular rate and rhythm, no murmur, rub or gallop.tachycardia Abdomen: Soft, non-tender,not distended. Bowel sounds normal. No masses Skin: No rashes or lesions. Not Jaundiced Lymph: Cervical, supraclavicular normal. Neurologic: Grossly non-focal Pertinent Labs As below  Health maintenance Vaccination  Vaccine Date last given  comment  Influenza    Hepatitis B    Hepatitis A    Prevnar-PCV-13 07/06/16   Pneumovac-PPSV-23 08/31/16   TdaP 08/08/18   HPV    Shingrix ( zoster vaccine) 08/19/19 and sept 2020 2 doses received   ______________________  Labs Lab Result  Date comment  HIV VL <20 08/15/18   CD4 786 ( 49%) 02/19/20   Genotype     [id number]     HIV antibody     RPR NR 08/21/19   Quantiferon Gold NEG 02/19/20   Hep C ab NR 08/15/18   Hepatitis B-ab,ag,c Neg 08/15/18   Hepatitis A-IgM, IgG /T Neg 08/15/18   Lipid 120/281/25/38 12/2019   GC/CHL     PAP     HB,PLT,Cr, LFT Cr 0.9 08/15/18     Preventive  Procedure Result  Date comment  colonoscopy     Dental exam Uptodate NOV 2020   Opthal Uptodate      Impression/Recommendation ? ?HIV- on triumeq-doing well 100% adherent, last labs from Aug  2021 Vl 60 and cd4 is 786 As you know sure archive no resistance we will switch Triumeq which has abacavir, 3TC and dolutegravir to Dovato which has 3TC and tolerated aware.  The 2 drug regimen would be sufficient and will also eliminate side effects of abacavir.   Newly diagnosed SVT while doing stress test- underwent Holter- saw cardiology- propanolol which he was taking for migraine increased to BID dose.  No further intervention currently.  Will need HEPB/vaccine- he wants to wait   DM-on JArdiance, trulicity,  - managed by his PCP-last Hba1c  is 7.9-    HTN on losartan and propanalol  hypertriglycerdemia ? Nicotine dependence= pt not ready to quit  ___________________________________________________ Discussed with patient in detail Follow up3 months - labs today

## 2021-08-29 LAB — QUANTIFERON-TB GOLD PLUS
QuantiFERON Mitogen Value: >10 IU/mL
QuantiFERON Nil Value: 0.01 IU/mL
QuantiFERON TB1 Ag Value: 0.02 IU/mL
QuantiFERON TB2 Ag Value: 0.03 IU/mL
QuantiFERON-TB Gold Plus: NEGATIVE

## 2021-08-29 LAB — T-HELPER CELLS CD4/CD8 %
% CD 4 Pos. Lymph.: 49.7 % (ref 30.8–58.5)
Absolute CD 4 Helper: 1093 /uL (ref 359–1519)
Basophils Absolute: 0.1 10*3/uL (ref 0.0–0.2)
Basos: 1 %
CD3+CD4+ Cells/CD3+CD8+ Cells Bld: 1.36 (ref 0.92–3.72)
CD3+CD8+ Cells # Bld: 805 /uL (ref 109–897)
CD3+CD8+ Cells NFr Bld: 36.6 % — ABNORMAL HIGH (ref 12.0–35.5)
EOS (ABSOLUTE): 0.1 10*3/uL (ref 0.0–0.4)
Eos: 1 %
Hematocrit: 48.4 % (ref 37.5–51.0)
Hemoglobin: 16.8 g/dL (ref 13.0–17.7)
Immature Grans (Abs): 0 10*3/uL (ref 0.0–0.1)
Immature Granulocytes: 0 %
Lymphocytes Absolute: 2.2 10*3/uL (ref 0.7–3.1)
Lymphs: 23 %
MCH: 31.3 pg (ref 26.6–33.0)
MCHC: 34.7 g/dL (ref 31.5–35.7)
MCV: 90 fL (ref 79–97)
Monocytes Absolute: 0.6 10*3/uL (ref 0.1–0.9)
Monocytes: 7 %
Neutrophils Absolute: 6.7 10*3/uL (ref 1.4–7.0)
Neutrophils: 68 %
Platelets: 178 10*3/uL (ref 150–450)
RBC: 5.37 x10E6/uL (ref 4.14–5.80)
RDW: 13.5 % (ref 11.6–15.4)
WBC: 9.7 10*3/uL (ref 3.4–10.8)

## 2021-08-29 LAB — HIV-1 RNA QUANT-NO REFLEX-BLD: HIV-1 RNA Viral Load: <20 copies/mL

## 2021-09-09 ENCOUNTER — Telehealth: Payer: Self-pay

## 2021-09-09 NOTE — Telephone Encounter (Signed)
-----   Message from Lynn Ito, MD sent at 09/03/2021 11:38 AM EST ----- Labs- Vl, cd4, quant all look great. Thx  ----- Message ----- From: Nell Range Lab Results In Sent: 09/03/2021   6:36 AM EST To: Lynn Ito, MD

## 2021-09-09 NOTE — Telephone Encounter (Signed)
Patient advised of lab results and verbalized understanding. Andrew York T Haziel Molner  

## 2021-11-24 ENCOUNTER — Encounter: Payer: Self-pay | Admitting: Infectious Diseases

## 2021-11-24 ENCOUNTER — Ambulatory Visit: Payer: Managed Care, Other (non HMO) | Attending: Infectious Diseases | Admitting: Infectious Diseases

## 2021-11-24 VITALS — BP 150/84 | HR 96 | Temp 97.9°F | Ht 70.0 in | Wt 181.0 lb

## 2021-11-24 DIAGNOSIS — B20 Human immunodeficiency virus [HIV] disease: Secondary | ICD-10-CM

## 2021-11-24 DIAGNOSIS — I10 Essential (primary) hypertension: Secondary | ICD-10-CM | POA: Insufficient documentation

## 2021-11-24 DIAGNOSIS — G43909 Migraine, unspecified, not intractable, without status migrainosus: Secondary | ICD-10-CM | POA: Diagnosis not present

## 2021-11-24 DIAGNOSIS — I471 Supraventricular tachycardia: Secondary | ICD-10-CM | POA: Diagnosis not present

## 2021-11-24 DIAGNOSIS — E781 Pure hyperglyceridemia: Secondary | ICD-10-CM | POA: Diagnosis not present

## 2021-11-24 DIAGNOSIS — E119 Type 2 diabetes mellitus without complications: Secondary | ICD-10-CM | POA: Insufficient documentation

## 2021-11-24 DIAGNOSIS — Z79899 Other long term (current) drug therapy: Secondary | ICD-10-CM | POA: Insufficient documentation

## 2021-11-24 DIAGNOSIS — F17200 Nicotine dependence, unspecified, uncomplicated: Secondary | ICD-10-CM | POA: Diagnosis not present

## 2021-11-24 DIAGNOSIS — Z21 Asymptomatic human immunodeficiency virus [HIV] infection status: Secondary | ICD-10-CM | POA: Diagnosis present

## 2021-11-24 DIAGNOSIS — Z7984 Long term (current) use of oral hypoglycemic drugs: Secondary | ICD-10-CM | POA: Diagnosis not present

## 2021-11-24 MED ORDER — DOVATO 50-300 MG PO TABS: 1.0000 | ORAL_TABLET | Freq: Every day | ORAL | 6 refills | Status: DC

## 2021-11-24 NOTE — Patient Instructions (Signed)
You are here for follow up- continue dovato. Will do labs in 3 months and follow up 3 month ?

## 2021-11-24 NOTE — Progress Notes (Signed)
NAME: Andrew York  ?DOB: 1955/10/19  ?MRN: [id number]  ?Date/Time: 11/24/2021 8:40 AM ? ?Subjective:  ?Follow up visit ?Patient is doing well ?Changed to dovato from triumeq in feb 2023 ?? ?MALYK GIROUARD is a 66 y.o. male with a history of  HIV, HTN, DM, SVT  ? Pt is currently taking dovato instead of triumeq and he is doing well- has more energy. Lost 9 pounds ( may not be related to the switch) ? ?100% Percent adherent to HAART. ?Last VL <20 and cd4 > 1000 ? ?HIV diagnosed 2011 ? ?Nadir Cd4>300 ?OI -shingles ? ?HAARt history-atripla, triumeq ?Now Dovato ?Acquired thru sex with men ?Genotype-NA ? ?PMH ?SVT ?Migraine ?Renal stone ?HTN ?DM ?Tremor rt hand ?Nicotine addiction ?Statin myopathy ?  ?PSH ?Appendectomy ?cholecystectomy ?Renal stone removal/Ureteral stent ?Trigger finger release rt 08/22/15 ?  ?SH ?Lives on his own  ?Smoker ?alcohol weekends ?Occasional marijuana ?Not sexually active in 38yrs ?Works at [employer]  ?  ?  ?FH ?Dm-mom -Rosealee Albee yr old lives in [place] ?Father -deceased  ?Allergies  ?Allergen Reactions  ? Hydrocodone-Acetaminophen Itching  ? Oxycodone-Acetaminophen Itching  ? Ace Inhibitors Cough  ?  Other reaction(s): Cough ?  ? Diclofenac Potassium(Migraine) Other (See Comments)  ? Hydrochlorothiazide Other (See Comments)  ?  Gout ?Gout ?  ? Other Other (See Comments)  ? Statins   ?  Other reaction(s): Muscle Pain  ? Sulfamethoxazole-Trimethoprim Other (See Comments)  ? ? ? ?Current Outpatient Medications  ?Medication Sig Dispense Refill  ? dolutegravir-lamiVUDine (DOVATO) 50-300 MG tablet Take 1 tablet by mouth daily. 30 tablet 3  ? empagliflozin (JARDIANCE) 25 MG TABS tablet Take 25 mg by mouth daily.    ? losartan (COZAAR) 100 MG tablet Take 100 mg by mouth daily.    ? pantoprazole (PROTONIX) 40 MG tablet Take 40 mg by mouth daily.    ? propranolol (INDERAL) 80 MG tablet Take 80 mg by mouth 3 (three) times daily.    ? rOPINIRole (REQUIP) 1 MG tablet Take 1 mg by mouth daily.    ?  tiotropium (SPIRIVA HANDIHALER) 18 MCG inhalation capsule Place 18 mcg into inhaler and inhale daily.    ? TRULICITY 0.75 MG/0.5ML SOPN Inject 0.75 pens as directed once a week.    ? topiramate (TOPAMAX) 25 MG tablet Take by mouth.    ? venlafaxine XR (EFFEXOR-XR) 37.5 MG 24 hr capsule Take 1 capsule by mouth daily.    ? ?No current facility-administered medications for this visit.  ?  ?REVIEW OF SYSTEMS:  ?Const: negative fever, negative chills, negative weight loss ?Eyes: negative diplopia or visual changes, negative eye pain ?ENT: negative coryza, negative sore throat ?Resp: negative cough, hemoptysis, dyspnea ?Cards: palpitations, occasional dizziness ?GU: negative for frequency, dysuria and hematuria ?Skin: negative for rash and pruritus ?Heme: negative for easy bruising and gum/nose bleeding ?MS: negative for myalgias, arthralgias, back pain and muscle weakness ?Neurolo:negative for headaches, dizziness, vertigo, memory problems  ?Psych: negative for feelings of anxiety, depression  ? ?Objective:  ?VITALS:  ?BP (!) 150/84   Pulse 96   Temp 97.9 ?F (36.6 ?C) (Temporal)   Ht 5\' 10"  (1.778 m)   Wt 181 lb (82.1 kg)   BMI 25.97 kg/m? PHYSICAL EXAM:  ?General: looks well ?Head: Normocephalic, without obvious abnormality, atraumatic. ?Eyes:Pupils equal and reacting to light ?Nose: Nares normal. No drainage or sinus tenderness. ?Throat: Lips, mucosa, and tongue normal. No Thrush ?Good dentition ?Neck: Supple, symmetrical, no adenopathy, thyroid: non tender ?no  carotid bruit and no JVD. ?Back: No CVA tenderness. ?Lungs: Clear to auscultation bilaterally. No Wheezing or Rhonchi. No rales. ?Heart: Regular rate and rhythm, no murmur, rub or gallop.tachycardia ?Abdomen: Soft, non-tender,not distended. Bowel sounds normal. No masses ?Skin: No rashes or lesions. Not Jaundiced ?Lymph: Cervical, supraclavicular normal. ?Neurologic: Grossly non-focal ?Pertinent Labs ?As below ? ?Health maintenance ?Vaccination ? ?Vaccine  Date last given comment  ?Influenza    ?Hepatitis B    ?Hepatitis A    ?Prevnar-PCV-13 07/06/16   ?Pneumovac-PPSV-23 08/31/16   ?TdaP 08/08/18   ?HPV    ?Shingrix ( zoster vaccine) 08/19/19 and sept 2020 2 doses received  ? ?______________________ ? ?Labs ?Lab Result  Date comment  ?HIV VL <20 08/15/18   ?CD4 1093 08/25/21   ?Genotype     ?[id number]     ?HIV antibody     ?RPR NR 08/21/19   ?Quantiferon Gold NEG 08/25/21   ?Hep C ab NR 08/15/18   ?Hepatitis B-ab,ag,c Neg 08/15/18   ?Hepatitis A-IgM, IgG /T Neg 08/15/18   ?Lipid 120/281/25/38 12/2019   ?GC/CHL     ?PAP     ?HB,PLT,Cr, LFT Cr 0.9 08/15/18   ? ? ?Preventive  ?Procedure Result  Date comment  ?colonoscopy     ?Dental exam Uptodate NOV 2020   ?Opthal Uptodate    ? ? ?Impression/Recommendation ?? ??HIV- on Dovato-doing well 100% adherent, last labs from feb 2023 Vl <20and cd4>1000 ? ?  SVT followed cardiology- propanolol which he was taking for migraine increased to BID dose.  No further intervention currently. ? ?Will need HEPB/vaccine- he wants to wait ?  ?DM-on JArdiance, trulicity,  - managed by his PCP-last Hba1c  is 7.9- Jan 2023 ?  ?HTN on losartan  ?hypertriglycerdemia ?? ?Nicotine dependence= pt not ready to quit ? ___________________________________________________ ?Discussed with patient in detail ?Follow up3 months - labs in 3 months ?

## 2022-02-23 ENCOUNTER — Other Ambulatory Visit: Payer: Self-pay | Admitting: Infectious Diseases

## 2022-02-25 LAB — T-HELPER CELLS CD4/CD8 %
% CD 4 Pos. Lymph.: 48.8 % (ref 30.8–58.5)
Absolute CD 4 Helper: 1025 /uL (ref 359–1519)
Basophils Absolute: 0.1 10*3/uL (ref 0.0–0.2)
Basos: 1 %
CD3+CD4+ Cells/CD3+CD8+ Cells Bld: 1.39 (ref 0.92–3.72)
CD3+CD8+ Cells # Bld: 739 /uL (ref 109–897)
CD3+CD8+ Cells NFr Bld: 35.2 % (ref 12.0–35.5)
EOS (ABSOLUTE): 0.1 10*3/uL (ref 0.0–0.4)
Eos: 1 %
Hematocrit: 48.6 % (ref 37.5–51.0)
Hemoglobin: 16.8 g/dL (ref 13.0–17.7)
Immature Grans (Abs): 0 10*3/uL (ref 0.0–0.1)
Immature Granulocytes: 0 %
Lymphocytes Absolute: 2.1 10*3/uL (ref 0.7–3.1)
Lymphs: 19 %
MCH: 31.9 pg (ref 26.6–33.0)
MCHC: 34.6 g/dL (ref 31.5–35.7)
MCV: 92 fL (ref 79–97)
Monocytes Absolute: 0.7 10*3/uL (ref 0.1–0.9)
Monocytes: 6 %
Neutrophils Absolute: 8.3 10*3/uL — ABNORMAL HIGH (ref 1.4–7.0)
Neutrophils: 73 %
Platelets: 186 10*3/uL (ref 150–450)
RBC: 5.27 x10E6/uL (ref 4.14–5.80)
RDW: 13.5 % (ref 11.6–15.4)
WBC: 11.3 10*3/uL — ABNORMAL HIGH (ref 3.4–10.8)

## 2022-02-25 LAB — HIV-1 RNA QUANT-NO REFLEX-BLD: HIV-1 RNA Viral Load: <20 copies/mL

## 2022-02-25 LAB — RPR: RPR Ser Ql: NONREACTIVE

## 2022-03-02 ENCOUNTER — Ambulatory Visit: Payer: Managed Care, Other (non HMO) | Attending: Infectious Diseases | Admitting: Infectious Diseases

## 2022-03-02 VITALS — BP 155/82 | HR 85 | Temp 98.0°F | Ht 70.0 in | Wt 184.0 lb

## 2022-03-02 DIAGNOSIS — I471 Supraventricular tachycardia: Secondary | ICD-10-CM | POA: Diagnosis not present

## 2022-03-02 DIAGNOSIS — I10 Essential (primary) hypertension: Secondary | ICD-10-CM | POA: Insufficient documentation

## 2022-03-02 DIAGNOSIS — G43909 Migraine, unspecified, not intractable, without status migrainosus: Secondary | ICD-10-CM | POA: Diagnosis not present

## 2022-03-02 DIAGNOSIS — B20 Human immunodeficiency virus [HIV] disease: Secondary | ICD-10-CM | POA: Insufficient documentation

## 2022-03-02 DIAGNOSIS — Z79899 Other long term (current) drug therapy: Secondary | ICD-10-CM | POA: Diagnosis not present

## 2022-03-02 DIAGNOSIS — E781 Pure hyperglyceridemia: Secondary | ICD-10-CM | POA: Insufficient documentation

## 2022-03-02 DIAGNOSIS — F17200 Nicotine dependence, unspecified, uncomplicated: Secondary | ICD-10-CM | POA: Insufficient documentation

## 2022-03-02 DIAGNOSIS — Z23 Encounter for immunization: Secondary | ICD-10-CM

## 2022-03-02 DIAGNOSIS — E119 Type 2 diabetes mellitus without complications: Secondary | ICD-10-CM | POA: Diagnosis not present

## 2022-03-02 DIAGNOSIS — Z79624 Long term (current) use of inhibitors of nucleotide synthesis: Secondary | ICD-10-CM | POA: Insufficient documentation

## 2022-03-02 NOTE — Progress Notes (Signed)
NAME: Andrew York  DOB: 1956-02-18  MRN: [id number]  Date/Time: 03/02/2022 8:43 AM  Subjective:  Follow up visit Since I last saw him in May 2023  he went to the cardiologist Started on diltiazem 120mg  XR   ? WILEY MAGAN is a 66 y.o. male with a history of  HIV, HTN, DM, SVT   Pt is currently taking dovato  and he is doing well- has more energy. 100% Percent adherent to HAART. 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      FH Dm-mom -alive-92 yr old lives in [employer] Father -deceased  Allergies  Allergen Reactions   Hydrocodone-Acetaminophen Itching   Oxycodone-Acetaminophen Itching   Ace Inhibitors Cough    Other reaction(s): Cough    Diclofenac Potassium(Migraine) Other (See Comments)   Hydrochlorothiazide Other (See Comments)    Gout Gout    Other Other (See Comments)   Statins     Other reaction(s): Muscle Pain   Sulfamethoxazole-Trimethoprim Other (See Comments)     Current Outpatient Medications  Medication Sig Dispense Refill   diltiazem (CARDIZEM CD) 120 MG 24 hr capsule Take 120 mg by mouth daily.     dolutegravir-lamiVUDine (DOVATO) 50-300 MG tablet Take 1 tablet by mouth daily. 30 tablet 6   Dulaglutide (TRULICITY) 4.5 MG/0.5ML SOPN      empagliflozin (JARDIANCE) 25 MG TABS tablet Take 25 mg by mouth daily.     losartan (COZAAR) 100 MG tablet Take 100 mg by mouth daily.     metFORMIN (GLUCOPHAGE-XR) 500 MG 24 hr tablet Take 500 mg by mouth daily.     pantoprazole (PROTONIX) 40 MG tablet Take 40 mg by  mouth daily.     propranolol (INDERAL) 80 MG tablet Take 80 mg by mouth 3 (three) times daily.     rOPINIRole (REQUIP) 1 MG tablet Take 1 mg by mouth daily.     tiotropium (SPIRIVA HANDIHALER) 18 MCG inhalation capsule Place 18 mcg into inhaler and inhale daily.     topiramate (TOPAMAX) 25 MG tablet Take by mouth.     venlafaxine XR (EFFEXOR-XR) 37.5 MG 24 hr capsule Take 1 capsule by mouth daily.     No current facility-administered medications for this visit.    REVIEW OF SYSTEMS:  Const: negative fever, negative chills, negative weight loss Eyes: negative diplopia or visual changes, negative eye pain ENT: negative coryza, negative sore throat Resp: negative cough, hemoptysis, dyspnea Cards: palpitations, occasional dizziness GU: negative for frequency, dysuria and hematuria Skin: negative for rash and pruritus Heme: negative for easy bruising and gum/nose bleeding MS: negative for myalgias, arthralgias, back pain and muscle weakness Neurolo:negative for headaches, dizziness, vertigo, memory problems  Psych: negative for feelings of anxiety, depression   Objective:  VITALS:  BP (!) 155/82   Pulse 85   Temp 98 F (36.7 C) (Temporal)   Ht 5\' 10"  (1.778 m)   Wt 184 lb (83.5 kg)   BMI 26.40 kg/m PHYSICAL EXAM:  General: looks well Head: Normocephalic, without obvious abnormality, atraumatic. Eyes:Pupils equal and reacting to light Nose: Nares normal.  No drainage or sinus tenderness. Throat: Lips, mucosa, and tongue normal. No Thrush Good dentition Neck: Supple, symmetrical, no adenopathy, thyroid: non tender no carotid bruit and no JVD. Back: No CVA tenderness. Lungs: Clear to auscultation bilaterally. No Wheezing or Rhonchi. No rales. Heart: Regular rate and rhythm, no murmur, rub or gallop.tachycardia Abdomen: Soft, non-tender,not distended. Bowel sounds normal. No masses Skin: No rashes or lesions. Not Jaundiced Lymph: Cervical, supraclavicular normal. Neurologic:  Grossly non-focal Pertinent Labs As below  Health maintenance Vaccination  Vaccine Date last given comment  Influenza    Hepatitis B    Hepatitis A    Prevnar-PCV-13 07/06/16   Pneumovac-PPSV-23 08/31/16   TdaP 08/08/18   HPV    Shingrix ( zoster vaccine) 08/19/19 and sept 2020 2 doses received   ______________________  Labs Lab Result  Date comment  HIV VL <20 08/15/18   CD4 1093 08/25/21   Genotype     [id number]     HIV antibody     RPR NR 08/21/19   Quantiferon Gold NEG 08/25/21   Hep C ab NR 08/15/18   Hepatitis B-ab,ag,c Neg 08/15/18   Hepatitis A-IgM, IgG /T Neg 08/15/18   Lipid 120/281/25/38 12/2019   GC/CHL     PAP     HB,PLT,Cr, LFT Cr 0.9 08/15/18     Preventive  Procedure Result  Date comment  colonoscopy     Dental exam Uptodate NOV 2020   Opthal Uptodate      Impression/Recommendation ? ?HIV- on Dovato-doing well 100% adherent, last labs from feb 2023 Vl <20and cd4>1000    SVT followed cardiology- propanolol which he was taking for migraine increased to BID dose.  No further intervention currently.  Will need HEPB/vaccine- he wants to wait   DM-on JArdiance, trulicity,  - managed by his PCP-last Hba1c  is 7.9- Jan 2023   HTN on losartan  hypertriglycerdemia ? Nicotine dependence= pt not ready to quit Pt has not been sexually active nor has had a partner since 2011 No risk for STDS  ___________________________________________________ Discussed with patient in detail Follow up 8 months

## 2022-03-02 NOTE — Patient Instructions (Signed)
You are here for follow

## 2022-04-22 IMAGING — CT CT CHEST LUNG CANCER SCREENING LOW DOSE W/O CM
1 series · 10 of 10 positions shown, 13 images · non-contrast
Comparison: 01/22/2020

CLINICAL DATA: 65-year-old male with 55 pack-year history of
smoking. Lung cancer screening.



[ct lung segmentation data · axial · 0.73mm/px · z∈[-375,-375]mm · 10 of 320 frames shown]
[frame 1/320  mediastinal]
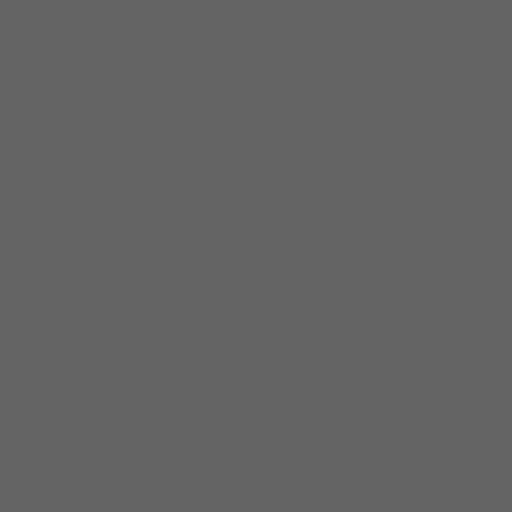
[frame 1/320  lung]
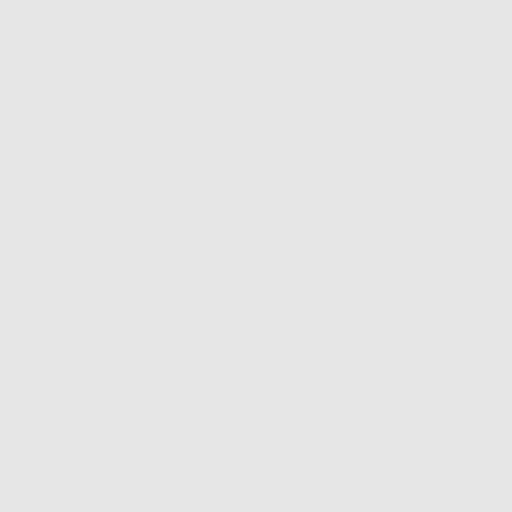
[frame 36/320  lung]
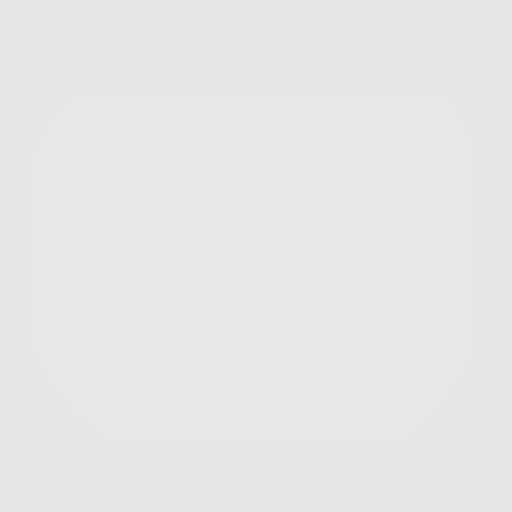
[frame 71/320  lung]
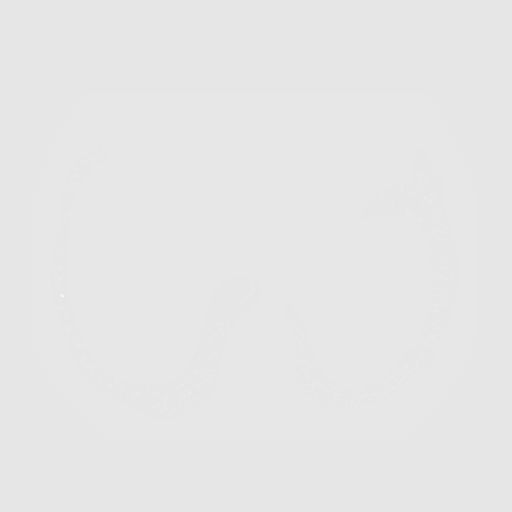
[frame 107/320  lung]
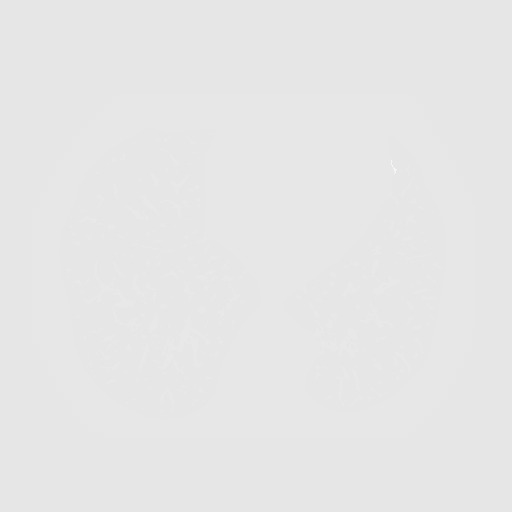
[frame 142/320  mediastinal]
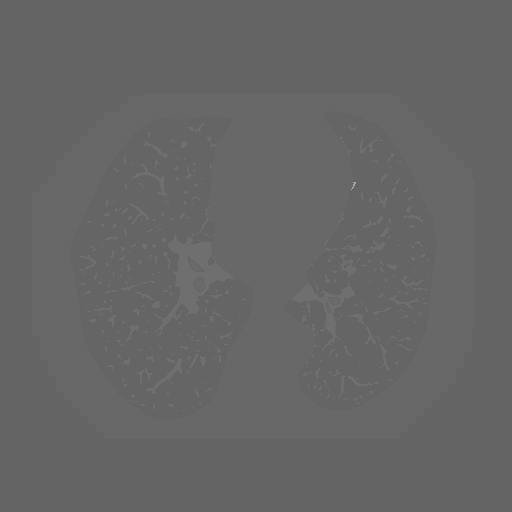
[frame 142/320  lung]
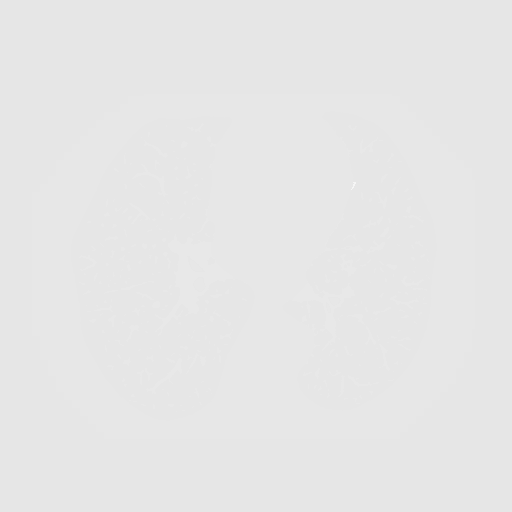
[frame 178/320  lung]
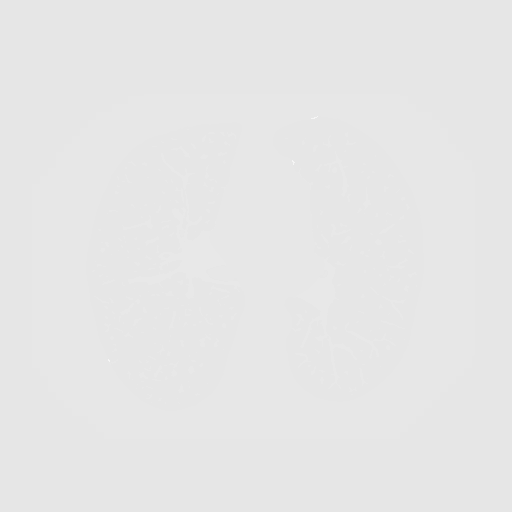
[frame 213/320  lung]
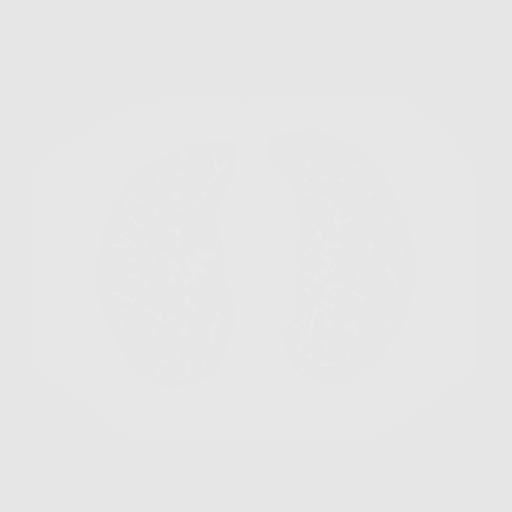
[frame 249/320  lung]
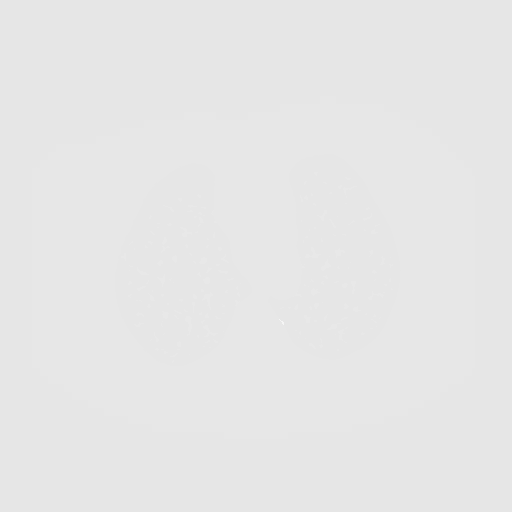
[frame 284/320  mediastinal]
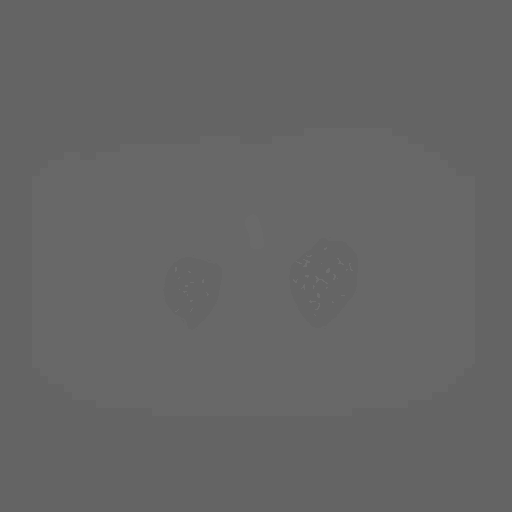
[frame 284/320  lung]
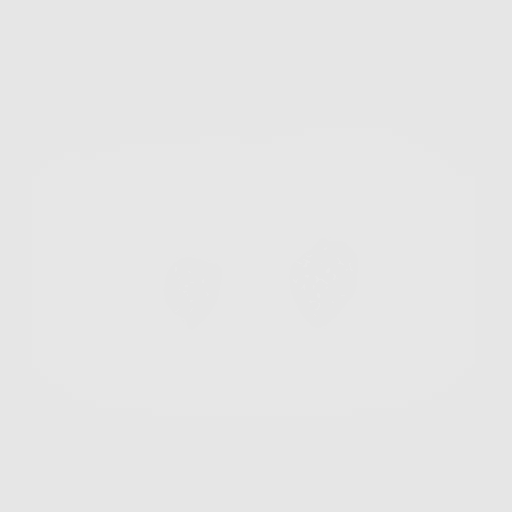
[frame 320/320  lung]
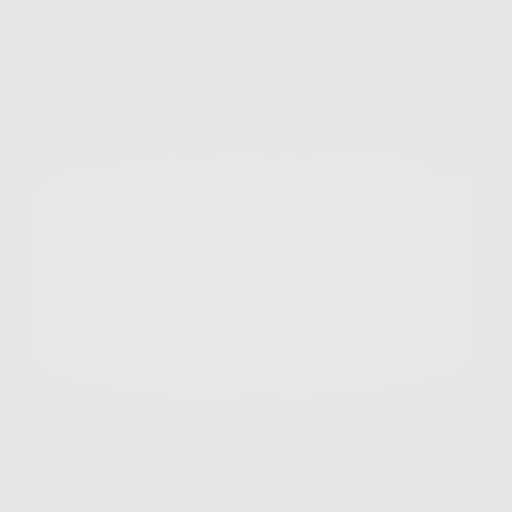

[10 of 10 positions shown; findings below may reference images not displayed]

FINDINGS: Cardiovascular: The heart size is normal. No substantial pericardial
effusion. Coronary artery calcification is evident. Mild
atherosclerotic calcification is noted in the wall of the thoracic
aorta.

Mediastinum/Nodes: No mediastinal lymphadenopathy. No evidence for
gross hilar lymphadenopathy although assessment is limited by the
lack of intravenous contrast on the current study. The esophagus has
normal imaging features. There is borderline axillary
lymphadenopathy, stable and likely reactive. Prominent 10 mm short
axis right axillary node was 12 mm previously.

Lungs/Pleura: Centrilobular and paraseptal emphysema evident. Tiny
bilateral pulmonary nodules again noted. No new suspicious pulmonary
nodule or mass. No focal airspace consolidation. No pleural
effusion.

Upper Abdomen: Unremarkable.

Musculoskeletal: No worrisome lytic or sclerotic osseous
abnormality.
IMPRESSION: 1. Lung-RADS 2, benign appearance or behavior. Continue annual
screening with low-dose chest CT without contrast in 12 months.
2.  Emphysema (Y210B-QIU.8) and Aortic Atherosclerosis (Y210B-170.0)

## 2022-08-18 ENCOUNTER — Ambulatory Visit: Payer: Managed Care, Other (non HMO)

## 2022-08-20 ENCOUNTER — Ambulatory Visit
Admission: RE | Admit: 2022-08-20 | Discharge: 2022-08-20 | Disposition: A | Discharge status: Home / Self Care | Payer: Managed Care, Other (non HMO) | Source: Ambulatory Visit | Attending: Acute Care | Admitting: Acute Care

## 2022-08-20 DIAGNOSIS — F17210 Nicotine dependence, cigarettes, uncomplicated: Secondary | ICD-10-CM

## 2022-08-20 DIAGNOSIS — Z87891 Personal history of nicotine dependence: Secondary | ICD-10-CM

## 2022-08-23 ENCOUNTER — Other Ambulatory Visit: Payer: Self-pay

## 2022-08-23 DIAGNOSIS — F17210 Nicotine dependence, cigarettes, uncomplicated: Secondary | ICD-10-CM

## 2022-08-23 DIAGNOSIS — Z87891 Personal history of nicotine dependence: Secondary | ICD-10-CM

## 2022-10-12 ENCOUNTER — Other Ambulatory Visit: Payer: Self-pay | Admitting: Infectious Diseases

## 2022-10-19 ENCOUNTER — Ambulatory Visit: Payer: Managed Care, Other (non HMO) | Attending: Infectious Diseases | Admitting: Infectious Diseases

## 2022-10-19 ENCOUNTER — Encounter: Payer: Self-pay | Admitting: Infectious Diseases

## 2022-10-19 VITALS — BP 165/92 | HR 94 | Ht 70.0 in | Wt 186.0 lb

## 2022-10-19 DIAGNOSIS — I10 Essential (primary) hypertension: Secondary | ICD-10-CM | POA: Diagnosis not present

## 2022-10-19 DIAGNOSIS — Z21 Asymptomatic human immunodeficiency virus [HIV] infection status: Secondary | ICD-10-CM | POA: Diagnosis not present

## 2022-10-19 DIAGNOSIS — I471 Supraventricular tachycardia, unspecified: Secondary | ICD-10-CM | POA: Diagnosis not present

## 2022-10-19 DIAGNOSIS — Z79899 Other long term (current) drug therapy: Secondary | ICD-10-CM | POA: Insufficient documentation

## 2022-10-19 DIAGNOSIS — Z79624 Long term (current) use of inhibitors of nucleotide synthesis: Secondary | ICD-10-CM | POA: Diagnosis not present

## 2022-10-19 DIAGNOSIS — F17200 Nicotine dependence, unspecified, uncomplicated: Secondary | ICD-10-CM | POA: Insufficient documentation

## 2022-10-19 DIAGNOSIS — Z833 Family history of diabetes mellitus: Secondary | ICD-10-CM | POA: Insufficient documentation

## 2022-10-19 DIAGNOSIS — E119 Type 2 diabetes mellitus without complications: Secondary | ICD-10-CM | POA: Diagnosis not present

## 2022-10-19 DIAGNOSIS — G43909 Migraine, unspecified, not intractable, without status migrainosus: Secondary | ICD-10-CM | POA: Diagnosis not present

## 2022-10-19 DIAGNOSIS — B20 Human immunodeficiency virus [HIV] disease: Secondary | ICD-10-CM

## 2022-10-19 NOTE — Progress Notes (Signed)
NAME: Andrew York  DOB: Jul 20, 1955  MRN: [id number]  Date/Time: 10/19/2022 8:41 AM  Subjective:  Follow up visit Last seen Aug 2023 Since he last saw me had bene to a routine visit with cardiologist no new complaints No new medical problems 100% adherent to Dovato Not sexually active since 2011 Updated PMH/ FH/SH/  Following taken from previous note Andrew York is a 67 y.o. male with a history of  HIV, HTN, DM, SVT   Pt is currently taking dovato  and he is doing well-  100% Percent adherent to HAART. Last VL <20 and cd4 > 1000  HIV diagnosed 2011  Nadir Cd4>300 OI -shingles  HAARt history-atripla, triumeq Now Dovato Acquired thru sex with men Genotype-NA  PMH SVT Migraine Renal stone HTN DM Tremor rt hand Nicotine addiction Statin myopathy   PSH Appendectomy cholecystectomy Renal stone removal/Ureteral stent Trigger finger release rt 08/22/15   SH Lives on his own  Smoker alcohol weekends Occasional marijuana Not sexually active in 82yrs Works at [employer]      FH Dm-mom -alive-93 yrs old- lives in [place] Father -deceased  Allergies  Allergen Reactions   Hydrocodone-Acetaminophen Itching   Oxycodone-Acetaminophen Itching   Ace Inhibitors Cough    Other reaction(s): Cough    Diclofenac Potassium(Migraine) Other (See Comments)   Hydrochlorothiazide Other (See Comments)    Gout Gout    Other Other (See Comments)   Statins     Other reaction(s): Muscle Pain   Sulfamethoxazole-Trimethoprim Other (See Comments)     Current Outpatient Medications  Medication Sig Dispense Refill   diltiazem (CARDIZEM CD) 120 MG 24 hr capsule Take 120 mg by mouth daily.     dolutegravir-lamiVUDine (DOVATO) 50-300 MG tablet Take 1 tablet by mouth daily. 30 tablet 6   Dulaglutide (TRULICITY) 4.5 0000000 SOPN      empagliflozin (JARDIANCE) 25 MG TABS tablet Take 25 mg by mouth daily.     losartan (COZAAR) 100 MG tablet Take 100 mg by mouth daily.      metFORMIN (GLUCOPHAGE-XR) 500 MG 24 hr tablet Take 500 mg by mouth daily.     pantoprazole (PROTONIX) 40 MG tablet Take 40 mg by mouth daily.     propranolol (INDERAL) 80 MG tablet Take 80 mg by mouth 3 (three) times daily.     rOPINIRole (REQUIP) 1 MG tablet Take 1 mg by mouth daily.     tiotropium (SPIRIVA HANDIHALER) 18 MCG inhalation capsule Place 18 mcg into inhaler and inhale daily.     topiramate (TOPAMAX) 25 MG tablet Take by mouth.     venlafaxine XR (EFFEXOR-XR) 37.5 MG 24 hr capsule Take 1 capsule by mouth daily.     No current facility-administered medications for this visit.    REVIEW OF SYSTEMS:  Const: negative fever, negative chills, negative weight loss Eyes: negative diplopia or visual changes, negative eye pain ENT: negative coryza, negative sore throat Resp: negative cough, hemoptysis, dyspnea Cards: palpitations, occasional dizziness GU: negative for frequency, dysuria and hematuria Skin: negative for rash and pruritus Heme: negative for easy bruising and gum/nose bleeding MS: negative for myalgias, arthralgias, back pain and muscle weakness Neurolo:negative for headaches, dizziness, vertigo, memory problems  Psych: negative for feelings of anxiety, depression   Objective:  VITALS:  BP (!) 165/92   Pulse 94   Ht 5\' 10"  (1.778 m)   Wt 186 lb (84.4 kg)   BMI 26.69 kg/m PHYSICAL EXAM:  General: no distress Head: Normocephalic, without obvious abnormality,  atraumatic. Eyes:Pupils equal and reacting to light Nose: Nares normal. No drainage or sinus tenderness. Throat: Lips, mucosa, and tongue normal. No Thrush Good dentition Neck: Supple, symmetrical, no adenopathy, thyroid: non tender no carotid bruit and no JVD. Back: No CVA tenderness. Lungs: Clear to auscultation bilaterally. No Wheezing or Rhonchi. No rales. Heart: Regular rate and rhythm, no murmur, rub or gallop.tachycardia Abdomen: Soft, non-tender,not distended. Bowel sounds normal. No  masses Skin: No rashes or lesions. Not Jaundiced Lymph: Cervical, supraclavicular normal. Neurologic: Grossly non-focal Pertinent Labs As below  Health maintenance Vaccination  Vaccine Date last given comment  Influenza    Hepatitis B    Hepatitis A    Prevnar-PCV-13 07/06/16   Pneumovac-PPSV-23 08/31/16   TdaP 08/08/18   HPV    Shingrix ( zoster vaccine) 08/19/19 and sept 2020 2 doses received   ______________________  Labs Lab Result  Date comment  HIV VL 60 10/12/22   CD4 950 10/12/22   Genotype     [id number]     HIV antibody     RPR NR 08/21/19   Quantiferon Gold NEG 10/12/22   Hep C ab NR 08/15/18   Hepatitis B-ab,ag,c Neg 08/15/18   Hepatitis A-IgM, IgG /T Neg 08/15/18   Lipid 130/174/ 08/2022   GC/CHL     PAP     HB,PLT,Cr, LFT Cr 0.9 08/15/18     Preventive  Procedure Result  Date comment  colonoscopy     Dental exam Uptodate Feb 2024 Tooth extraction  Opthal Uptodate      Impression/Recommendation ? ?HIV- on Dovato-doing well 100% adherent, last labs from [facility] 2024  Vl 60 and cd4 950    SVT followed cardiology- propanolol which he was taking for migraine increased to BID dose.  Also on diltiazem  Will need HEPB/vaccine- he wants to wait   DM-on JArdiance, trulicity,  - managed by his PCP-last Hba1c  is 8.3    HTN on losartan  Hypertriglycerdemia- resolved ? Nicotine dependence= pt not ready to quit Pt has not been sexually active nor has had a partner since 2011 He does not want any STD screening or anal pap  ___________________________________________________ Discussed with patient in detail Follow up 8 months

## 2022-10-19 NOTE — Patient Instructions (Signed)
You are here for follow up- continue dovato- will

## 2022-10-21 LAB — COMPREHENSIVE METABOLIC PANEL
ALT: 15 IU/L (ref 0–44)
AST: 11 IU/L (ref 0–40)
Albumin/Globulin Ratio: 2.2 (ref 1.2–2.2)
Albumin: 4.4 g/dL (ref 3.9–4.9)
Alkaline Phosphatase: 109 IU/L (ref 44–121)
BUN/Creatinine Ratio: 15 (ref 10–24)
BUN: 15 mg/dL (ref 8–27)
Bilirubin Total: 0.4 mg/dL (ref 0.0–1.2)
CO2: 23 mmol/L (ref 20–29)
Calcium: 9.8 mg/dL (ref 8.6–10.2)
Chloride: 101 mmol/L (ref 96–106)
Creatinine, Ser: 1.02 mg/dL (ref 0.76–1.27)
Globulin, Total: 2 g/dL (ref 1.5–4.5)
Glucose: 300 mg/dL — ABNORMAL HIGH (ref 70–99)
Potassium: 4.1 mmol/L (ref 3.5–5.2)
Sodium: 141 mmol/L (ref 134–144)
Total Protein: 6.4 g/dL (ref 6.0–8.5)
eGFR: 82 mL/min/{1.73_m2} (ref >59)

## 2022-10-21 LAB — QUANTIFERON-TB GOLD PLUS
QuantiFERON Mitogen Value: >10 IU/mL
QuantiFERON Nil Value: 0.02 IU/mL
QuantiFERON TB1 Ag Value: 0.05 IU/mL
QuantiFERON TB2 Ag Value: 0.9 IU/mL
QuantiFERON-TB Gold Plus: POSITIVE — AB

## 2022-10-21 LAB — T-HELPER CELLS CD4/CD8 %
% CD 4 Pos. Lymph.: 45.1 % (ref 30.8–58.5)
Absolute CD 4 Helper: 902 /uL (ref 359–1519)
Basophils Absolute: 0 10*3/uL (ref 0.0–0.2)
Basos: 0 %
CD3+CD4+ Cells/CD3+CD8+ Cells Bld: 1.23 (ref 0.92–3.72)
CD3+CD8+ Cells # Bld: 732 /uL (ref 109–897)
CD3+CD8+ Cells NFr Bld: 36.6 % — ABNORMAL HIGH (ref 12.0–35.5)
EOS (ABSOLUTE): 0.1 10*3/uL (ref 0.0–0.4)
Eos: 1 %
Hematocrit: 50.8 % (ref 37.5–51.0)
Hemoglobin: 17.5 g/dL (ref 13.0–17.7)
Immature Grans (Abs): 0 10*3/uL (ref 0.0–0.1)
Immature Granulocytes: 0 %
Lymphocytes Absolute: 2 10*3/uL (ref 0.7–3.1)
Lymphs: 19 %
MCH: 31.1 pg (ref 26.6–33.0)
MCHC: 34.4 g/dL (ref 31.5–35.7)
MCV: 90 fL (ref 79–97)
Monocytes Absolute: 0.6 10*3/uL (ref 0.1–0.9)
Monocytes: 6 %
Neutrophils Absolute: 7.4 10*3/uL — ABNORMAL HIGH (ref 1.4–7.0)
Neutrophils: 74 %
Platelets: 181 10*3/uL (ref 150–450)
RBC: 5.62 x10E6/uL (ref 4.14–5.80)
RDW: 13.7 % (ref 11.6–15.4)
WBC: 10.1 10*3/uL (ref 3.4–10.8)

## 2022-10-21 LAB — HIV-1 RNA QUANT-NO REFLEX-BLD
HIV-1 RNA Viral Load Log: 1.778 log10copy/mL
HIV-1 RNA Viral Load: 60 copies/mL

## 2022-10-28 ENCOUNTER — Telehealth: Payer: Self-pay

## 2022-10-28 ENCOUNTER — Other Ambulatory Visit: Payer: Self-pay | Admitting: Infectious Diseases

## 2022-10-28 DIAGNOSIS — R7612 Nonspecific reaction to cell mediated immunity measurement of gamma interferon antigen response without active tuberculosis: Secondary | ICD-10-CM

## 2022-10-28 NOTE — Telephone Encounter (Signed)
Patient informed and will stop by to get order to go to [facility]

## 2022-10-28 NOTE — Telephone Encounter (Signed)
-----   Message from Lynn Ito, MD sent at 10/27/2022 10:01 PM EDT ----- Please let him know that the quantiferon gold test came back positive-( it had been negative before) could be a lab error or he could have had exposure to TB . Will repeat the test . Thx Other tests okay except blood sugar which was high ----- Message ----- From: Interface, [facility] Lab Results In Sent: 10/24/2022   9:36 PM EDT To: Lynn Ito, MD

## 2022-10-28 NOTE — Telephone Encounter (Signed)
Patient informed of lab results and verbalized understanding.  When would you like the patient to repeat the Quantiferon Gold test?

## 2022-11-02 ENCOUNTER — Other Ambulatory Visit: Payer: Self-pay | Admitting: Infectious Diseases

## 2022-11-02 ENCOUNTER — Other Ambulatory Visit: Payer: Self-pay

## 2022-11-02 DIAGNOSIS — R7612 Nonspecific reaction to cell mediated immunity measurement of gamma interferon antigen response without active tuberculosis: Secondary | ICD-10-CM

## 2022-11-05 LAB — QUANTIFERON-TB GOLD PLUS
QuantiFERON Mitogen Value: >10 IU/mL
QuantiFERON Nil Value: 0 IU/mL
QuantiFERON TB1 Ag Value: 0.02 IU/mL
QuantiFERON TB2 Ag Value: 0.02 IU/mL
QuantiFERON-TB Gold Plus: NEGATIVE

## 2022-11-08 ENCOUNTER — Telehealth: Payer: Self-pay

## 2022-11-08 NOTE — Telephone Encounter (Signed)
-----   Message from Lynn Ito, MD sent at 11/08/2022 10:48 AM EDT ----- Please let him know that the repeat quant was negative and no need to do anything- The previous test was a false positive test- thx  ----- Message ----- From: Interface, [facility] Lab Results In Sent: 11/05/2022   5:57 AM EDT To: Lynn Ito, MD

## 2022-11-08 NOTE — Telephone Encounter (Signed)
I spoke to the patient and advised him of lab results. Patient verbalized understanding. Andrew York T Pricilla Loveless

## 2022-12-07 ENCOUNTER — Other Ambulatory Visit: Payer: Self-pay

## 2022-12-07 MED ORDER — DOVATO 50-300 MG PO TABS: 1.0000 | ORAL_TABLET | Freq: Every day | ORAL | 6 refills | Status: DC

## 2022-12-07 NOTE — Telephone Encounter (Signed)
error 

## 2023-05-09 ENCOUNTER — Other Ambulatory Visit: Payer: Self-pay

## 2023-06-14 ENCOUNTER — Other Ambulatory Visit: Payer: Self-pay | Admitting: Infectious Diseases

## 2023-06-15 LAB — T-HELPER CELLS CD4/CD8 %
% CD 4 Pos. Lymph.: 51.2 % (ref 30.8–58.5)
Absolute CD 4 Helper: 768 /uL (ref 359–1519)
Basophils Absolute: 0.1 10*3/uL (ref 0.0–0.2)
Basos: 1 %
CD3+CD4+ Cells/CD3+CD8+ Cells Bld: 1.47 (ref 0.92–3.72)
CD3+CD8+ Cells # Bld: 524 /uL (ref 109–897)
CD3+CD8+ Cells NFr Bld: 34.9 % (ref 12.0–35.5)
EOS (ABSOLUTE): 0.1 10*3/uL (ref 0.0–0.4)
Eos: 1 %
Hematocrit: 47.6 % (ref 37.5–51.0)
Hemoglobin: 15.7 g/dL (ref 13.0–17.7)
Immature Grans (Abs): 0 10*3/uL (ref 0.0–0.1)
Immature Granulocytes: 0 %
Lymphocytes Absolute: 1.5 10*3/uL (ref 0.7–3.1)
Lymphs: 19 %
MCH: 31.2 pg (ref 26.6–33.0)
MCHC: 33 g/dL (ref 31.5–35.7)
MCV: 94 fL (ref 79–97)
Monocytes Absolute: 0.5 10*3/uL (ref 0.1–0.9)
Monocytes: 7 %
Neutrophils Absolute: 5.7 10*3/uL (ref 1.4–7.0)
Neutrophils: 72 %
Platelets: 159 10*3/uL (ref 150–450)
RBC: 5.04 x10E6/uL (ref 4.14–5.80)
RDW: 13.7 % (ref 11.6–15.4)
WBC: 7.9 10*3/uL (ref 3.4–10.8)

## 2023-06-15 LAB — RPR: RPR Ser Ql: NONREACTIVE

## 2023-06-15 LAB — HIV-1 RNA QUANT-NO REFLEX-BLD: HIV-1 RNA Viral Load: <20 {copies}/mL

## 2023-06-21 ENCOUNTER — Ambulatory Visit: Payer: Managed Care, Other (non HMO) | Attending: Infectious Diseases | Admitting: Infectious Diseases

## 2023-06-21 ENCOUNTER — Encounter: Payer: Self-pay | Admitting: Infectious Diseases

## 2023-06-21 VITALS — BP 162/90 | HR 87 | Temp 98.1°F | Ht 70.0 in | Wt 180.0 lb

## 2023-06-21 DIAGNOSIS — I10 Essential (primary) hypertension: Secondary | ICD-10-CM | POA: Diagnosis present

## 2023-06-21 DIAGNOSIS — Z79624 Long term (current) use of inhibitors of nucleotide synthesis: Secondary | ICD-10-CM | POA: Insufficient documentation

## 2023-06-21 DIAGNOSIS — B20 Human immunodeficiency virus [HIV] disease: Secondary | ICD-10-CM | POA: Diagnosis not present

## 2023-06-21 DIAGNOSIS — E119 Type 2 diabetes mellitus without complications: Secondary | ICD-10-CM | POA: Insufficient documentation

## 2023-06-21 DIAGNOSIS — Z79899 Other long term (current) drug therapy: Secondary | ICD-10-CM | POA: Diagnosis not present

## 2023-06-21 DIAGNOSIS — Z21 Asymptomatic human immunodeficiency virus [HIV] infection status: Secondary | ICD-10-CM | POA: Insufficient documentation

## 2023-06-21 DIAGNOSIS — I471 Supraventricular tachycardia, unspecified: Secondary | ICD-10-CM | POA: Insufficient documentation

## 2023-06-21 DIAGNOSIS — Z7984 Long term (current) use of oral hypoglycemic drugs: Secondary | ICD-10-CM | POA: Diagnosis not present

## 2023-06-21 MED ORDER — DOVATO 50-300 MG PO TABS: 1.0000 | ORAL_TABLET | Freq: Every day | ORAL | 6 refills | Status: DC

## 2023-06-21 NOTE — Progress Notes (Signed)
NAME: Andrew York  DOB: Mar 05, 1956  MRN: [id number]  Date/Time: 06/21/2023 10:15 AM  Subjective:  Follow up visit Last seen April 2024 no new complaints No new medical problems 100% adherent to Dovato Not sexually active since 2011 Updated PMH/ FH/SH/  Following taken from previous note Andrew York is a 67 y.o. male with a history of  HIV, HTN, DM, SVT   Pt is currently taking dovato  and he is doing well-  100% Percent adherent to HAART. Last VL <20 and cd4 758  HIV diagnosed 2011  Nadir Cd4>300 OI -shingles  HAARt history-atripla, triumeq Now Dovato Acquired thru sex with men Genotype-NA  PMH SVT Migraine Renal stone HTN DM Tremor rt hand Nicotine addiction Statin myopathy   PSH Appendectomy cholecystectomy Renal stone removal/Ureteral stent Trigger finger release rt 08/22/15   SH Lives on his own  Smoker alcohol weekends Occasional marijuana Not sexually active in 40yrs Works at [employer]      FH Dm-mom -alive-93 yrs old- lives in [place] Father -deceased  Allergies  Allergen Reactions   Hydrocodone-Acetaminophen Itching   Oxycodone-Acetaminophen Itching   Ace Inhibitors Cough    Other reaction(s): Cough    Diclofenac Potassium(Migraine) Other (See Comments)   Hydrochlorothiazide Other (See Comments)    Gout Gout    Other Other (See Comments)   Statins     Other reaction(s): Muscle Pain   Sulfamethoxazole-Trimethoprim Other (See Comments)     Current Outpatient Medications  Medication Sig Dispense Refill   diltiazem (CARDIZEM CD) 120 MG 24 hr capsule Take 120 mg by mouth daily.     dolutegravir-lamiVUDine (DOVATO) 50-300 MG tablet Take 1 tablet by mouth daily. 30 tablet 6   Dulaglutide (TRULICITY) 4.5 MG/0.5ML SOPN      empagliflozin (JARDIANCE) 25 MG TABS tablet Take 25 mg by mouth daily.     losartan (COZAAR) 100 MG tablet Take 100 mg by mouth daily.     propranolol (INDERAL) 80 MG tablet Take 80 mg by mouth 3 (three)  times daily.     rOPINIRole (REQUIP) 1 MG tablet Take 1 mg by mouth daily.     tiotropium (SPIRIVA HANDIHALER) 18 MCG inhalation capsule Place 18 mcg into inhaler and inhale daily.     topiramate (TOPAMAX) 25 MG tablet Take by mouth.     venlafaxine XR (EFFEXOR-XR) 37.5 MG 24 hr capsule Take 1 capsule by mouth daily.     No current facility-administered medications for this visit.    REVIEW OF SYSTEMS:  Const: negative fever, negative chills, negative weight loss Eyes: negative diplopia or visual changes, negative eye pain ENT: negative coryza, negative sore throat Resp: negative cough, hemoptysis, dyspnea Cards: palpitations, occasional dizziness GU: negative for frequency, dysuria and hematuria Skin: negative for rash and pruritus Heme: negative for easy bruising and gum/nose bleeding MS: negative for myalgias, arthralgias, back pain and muscle weakness Neurolo:negative for headaches, dizziness, vertigo, memory problems  Psych: negative for feelings of anxiety, depression   Objective:  VITALS:  BP (!) 162/90   Pulse 87   Temp 98.1 F (36.7 C) (Temporal)   Ht 5\' 10"  (1.778 m)   Wt 180 lb (81.6 kg)   BMI 25.83 kg/m   PHYSICAL EXAM:  General: no distress Head: Normocephalic, without obvious abnormality, atraumatic. Eyes:Pupils equal and reacting to light Nose: Nares normal. No drainage or sinus tenderness. Throat: Lips, mucosa, and tongue normal. No Thrush Good dentition Neck: Supple, symmetrical, no adenopathy, thyroid: non tender no carotid bruit  and no JVD. Back: No CVA tenderness. Lungs: Clear to auscultation bilaterally. No Wheezing or Rhonchi. No rales. Heart: Regular rate and rhythm, no murmur, rub or gallop.tachycardia Abdomen: Soft, non-tender,not distended. Bowel sounds normal. No masses Skin: No rashes or lesions. Not Jaundiced Lymph: Cervical, supraclavicular normal. Neurologic: Grossly non-focal Pertinent Labs As below  Health  maintenance Vaccination  Vaccine Date last given comment  Influenza Nov 2024   Hepatitis B    Hepatitis A    Prevnar-PCV-13 07/06/16   Pneumovac-PPSV-23 08/31/16   TdaP 08/08/18   HPV    Shingrix ( zoster vaccine) 08/19/19 and sept 2020 2 doses received   ______________________  Labs Lab Result  Date comment  HIV VL 60 10/12/22   CD4 950 10/12/22   Genotype     [id number]     HIV antibody     RPR NR 06/14/23   Quantiferon Gold NEG 10/12/22   Hep C ab NR 08/15/18   Hepatitis B-ab,ag,c Neg 08/15/18   Hepatitis A-IgM, IgG /T Neg 08/15/18   Lipid 130/174/ 08/2022   GC/CHL     PAP     HB,PLT,Cr, LFT Cr 0.9 08/15/18     Preventive  Procedure Result  Date comment  colonoscopy     Dental exam Uptodate Feb 2024 Tooth extraction  Opthal Uptodate      Impression/Recommendation ? ?HIV- on Dovato-doing well 100% adherent, last labs fromNov 2024  Vl 20 and cd4 758    SVT followed cardiology- propanolol which he was taking for migraine increased to BID dose.  Also on diltiazem  Will need HEPB/vaccine- he wants to wait   DM-on JArdiance, trulicity,  - managed by his PCP-last Hba1c  is 8.3    HTN on losartan  Hypertriglycerdemia- resolved ? Nicotine dependence= pt not ready to quit Pt has not been sexually active nor has had a partner since 2011 He does not want any STD screening or anal pap  ___________________________________________________ Discussed with patient in detail Follow up 8 months

## 2023-07-19 ENCOUNTER — Encounter: Payer: Self-pay | Admitting: *Deleted

## 2023-07-29 ENCOUNTER — Encounter: Payer: Self-pay | Admitting: Acute Care

## 2023-08-05 ENCOUNTER — Ambulatory Visit: Admission: RE | Admit: 2023-08-05 | Payer: Managed Care, Other (non HMO) | Source: Home / Self Care

## 2023-08-05 HISTORY — DX: Headache, unspecified: R51.9

## 2023-08-05 HISTORY — DX: Human immunodeficiency virus (HIV) disease: B20

## 2023-08-05 HISTORY — DX: Supraventricular tachycardia, unspecified: I47.10

## 2023-08-05 HISTORY — DX: Drug-induced myopathy: G72.0

## 2023-08-05 HISTORY — DX: Atherosclerotic heart disease of native coronary artery without angina pectoris: I25.10

## 2023-08-05 HISTORY — DX: Migraine, unspecified, not intractable, without status migrainosus: G43.909

## 2023-08-05 HISTORY — DX: Emphysema, unspecified: J43.9

## 2023-08-05 HISTORY — DX: Asymptomatic human immunodeficiency virus (hiv) infection status: Z21

## 2023-08-05 HISTORY — DX: Personal history of urinary calculi: Z87.442

## 2023-08-05 HISTORY — DX: Other obstructive and reflux uropathy: N40.1

## 2023-08-05 SURGERY — COLONOSCOPY WITH PROPOFOL
Anesthesia: General

## 2023-08-22 ENCOUNTER — Ambulatory Visit
Admission: RE | Admit: 2023-08-22 | Discharge: 2023-08-22 | Disposition: A | Discharge status: Home / Self Care | Payer: Managed Care, Other (non HMO) | Source: Ambulatory Visit | Attending: Acute Care | Admitting: Acute Care

## 2023-08-22 DIAGNOSIS — F17210 Nicotine dependence, cigarettes, uncomplicated: Secondary | ICD-10-CM

## 2023-08-22 DIAGNOSIS — Z87891 Personal history of nicotine dependence: Secondary | ICD-10-CM

## 2023-08-30 ENCOUNTER — Other Ambulatory Visit: Payer: Self-pay

## 2023-08-30 DIAGNOSIS — F17210 Nicotine dependence, cigarettes, uncomplicated: Secondary | ICD-10-CM

## 2023-08-30 DIAGNOSIS — Z87891 Personal history of nicotine dependence: Secondary | ICD-10-CM

## 2023-08-30 DIAGNOSIS — Z122 Encounter for screening for malignant neoplasm of respiratory organs: Secondary | ICD-10-CM

## 2024-01-17 ENCOUNTER — Ambulatory Visit: Payer: Managed Care, Other (non HMO) | Admitting: Infectious Diseases

## 2024-01-24 ENCOUNTER — Other Ambulatory Visit: Payer: Self-pay | Admitting: Nurse Practitioner

## 2024-01-24 DIAGNOSIS — R42 Dizziness and giddiness: Secondary | ICD-10-CM

## 2024-01-24 DIAGNOSIS — G720 Drug-induced myopathy: Secondary | ICD-10-CM

## 2024-01-24 DIAGNOSIS — K30 Functional dyspepsia: Secondary | ICD-10-CM

## 2024-01-24 DIAGNOSIS — N182 Chronic kidney disease, stage 2 (mild): Secondary | ICD-10-CM

## 2024-01-24 DIAGNOSIS — I2510 Atherosclerotic heart disease of native coronary artery without angina pectoris: Secondary | ICD-10-CM

## 2024-01-24 DIAGNOSIS — I10 Essential (primary) hypertension: Secondary | ICD-10-CM

## 2024-01-24 DIAGNOSIS — F17210 Nicotine dependence, cigarettes, uncomplicated: Secondary | ICD-10-CM

## 2024-01-31 ENCOUNTER — Ambulatory Visit: Attending: Infectious Diseases | Admitting: Infectious Diseases

## 2024-01-31 ENCOUNTER — Other Ambulatory Visit: Payer: Self-pay | Admitting: Infectious Diseases

## 2024-01-31 ENCOUNTER — Encounter: Payer: Self-pay | Admitting: Infectious Diseases

## 2024-01-31 VITALS — BP 162/79 | HR 94 | Temp 98.0°F | Ht 70.0 in | Wt 177.0 lb

## 2024-01-31 DIAGNOSIS — Z7984 Long term (current) use of oral hypoglycemic drugs: Secondary | ICD-10-CM | POA: Diagnosis not present

## 2024-01-31 DIAGNOSIS — I10 Essential (primary) hypertension: Secondary | ICD-10-CM | POA: Insufficient documentation

## 2024-01-31 DIAGNOSIS — E119 Type 2 diabetes mellitus without complications: Secondary | ICD-10-CM | POA: Diagnosis not present

## 2024-01-31 DIAGNOSIS — Z79624 Long term (current) use of inhibitors of nucleotide synthesis: Secondary | ICD-10-CM | POA: Diagnosis not present

## 2024-01-31 DIAGNOSIS — B20 Human immunodeficiency virus [HIV] disease: Secondary | ICD-10-CM | POA: Diagnosis present

## 2024-01-31 DIAGNOSIS — Z794 Long term (current) use of insulin: Secondary | ICD-10-CM | POA: Insufficient documentation

## 2024-01-31 DIAGNOSIS — G43909 Migraine, unspecified, not intractable, without status migrainosus: Secondary | ICD-10-CM | POA: Insufficient documentation

## 2024-01-31 DIAGNOSIS — F17200 Nicotine dependence, unspecified, uncomplicated: Secondary | ICD-10-CM | POA: Insufficient documentation

## 2024-01-31 DIAGNOSIS — Z79899 Other long term (current) drug therapy: Secondary | ICD-10-CM | POA: Diagnosis not present

## 2024-01-31 DIAGNOSIS — I471 Supraventricular tachycardia, unspecified: Secondary | ICD-10-CM | POA: Insufficient documentation

## 2024-01-31 MED ORDER — DOVATO 50-300 MG PO TABS: 1.0000 | ORAL_TABLET | Freq: Every day | ORAL | 12 refills | Status: AC

## 2024-01-31 NOTE — Progress Notes (Signed)
 NAME: Andrew York  DOB: [birth year]  MRN: [id number]  Date/Time: 01/31/2024 8:56 AM  Subjective:  Follow up visit Last seen 21-Jan-2025Mom passed away 07/22/2025Had some dizziness Saw cardiologist awaiting  Echo  100% adherent to Dovato  Not sexually active since 2011 Updated PMH/ FH/SH/  Following taken from previous note YOSSEF GILKISON is a 68 y.o. male with a history of  HIV, HTN, DM, SVT   Pt is currently taking dovato   and he is doing well-  100% Percent adherent to HAART. Last VL <20 and cd4 758  HIV diagnosed 2011  Nadir Cd4>300 OI -shingles  HAARt history-atripla, triumeq  Now Dovato  Acquired thru sex with men Genotype-NA  PMH SVT Migraine Renal stone HTN DM Tremor rt hand Nicotine addiction Statin myopathy   PSH Appendectomy cholecystectomy Renal stone removal/Ureteral stent Trigger finger release rt 08/22/15   SH Lives on his own  Smoker alcohol weekends Occasional marijuana Not sexually active in 37yrs Works at [employer]      FH Dm-mom -alive-93 yrs old- lives in [place]  Father -deceased  Allergies  Allergen Reactions   Hydrocodone-Acetaminophen Itching   Oxycodone-Acetaminophen Itching   Ace Inhibitors Cough    Other reaction(s): Cough    Diclofenac Potassium(Migraine) Other (See Comments)   Hydrochlorothiazide Other (See Comments)    Gout Gout    Other Other (See Comments)   Statins     Other reaction(s): Muscle Pain   Sulfamethoxazole-Trimethoprim Other (See Comments)     Current Outpatient Medications  Medication Sig Dispense Refill   albuterol (VENTOLIN HFA) 108 (90 Base) MCG/ACT inhaler Inhale 2 puffs into the lungs.     dolutegravir -lamiVUDine (DOVATO ) 50-300 MG tablet Take 1 tablet by mouth daily. 30 tablet 6   Dulaglutide  (TRULICITY ) 4.5 MG/0.5ML SOPN      empagliflozin  (JARDIANCE ) 25 MG TABS tablet Take 25 mg by mouth daily.     losartan  (COZAAR ) 100 MG tablet Take 100 mg by mouth daily.     metFORMIN   (GLUCOPHAGE ) 500 MG tablet Take 500 mg by mouth 2 (two) times daily with a meal.     pioglitazone (ACTOS) 30 MG tablet Take 30 mg by mouth daily.     propranolol  (INDERAL ) 80 MG tablet Take 80 mg by mouth 3 (three) times daily.     rOPINIRole  (REQUIP ) 1 MG tablet Take 1 mg by mouth daily.     tiotropium (SPIRIVA  HANDIHALER) 18 MCG inhalation capsule Place 18 mcg into inhaler and inhale daily.     topiramate  (TOPAMAX ) 25 MG tablet Take by mouth.     venlafaxine  XR (EFFEXOR -XR) 37.5 MG 24 hr capsule Take 1 capsule by mouth daily.     aspirin EC 81 MG tablet Take 81 mg by mouth once.     diltiazem  (CARDIZEM  CD) 120 MG 24 hr capsule Take 120 mg by mouth daily.     No current facility-administered medications for this visit.    REVIEW OF SYSTEMS:  Const: negative fever, negative chills, negative weight loss Eyes: negative diplopia or visual changes, negative eye pain ENT: negative coryza, negative sore throat Resp: negative cough, hemoptysis, dyspnea Cards: palpitations, occasional dizziness GU: negative for frequency, dysuria and hematuria Skin: negative for rash and pruritus Heme: negative for easy bruising and gum/nose bleeding MS: negative for myalgias, arthralgias, back pain and muscle weakness Neurolo:negative for headaches, dizziness, vertigo, memory problems  Psych: negative for feelings of anxiety, depression   Objective:  VITALS:  Ht 5' 10 (1.778 m)  Wt 177 lb (80.3 kg)   BMI 25.40 kg/m   PHYSICAL EXAM:  General: no distress Head: Normocephalic, without obvious abnormality, atraumatic. Eyes:Pupils equal and reacting to light Nose: Nares normal. No drainage or sinus tenderness. Throat: Lips, mucosa, and tongue normal. No Thrush Good dentition Neck: Supple, symmetrical, no adenopathy, thyroid : non tender no carotid bruit and no JVD. Back: No CVA tenderness. Lungs: Clear to auscultation bilaterally. No Wheezing or Rhonchi. No rales. Heart: Regular rate and rhythm, no  murmur, rub or gallop.tachycardia Abdomen: Soft, non-tender,not distended. Bowel sounds normal. No masses Skin: No rashes or lesions. Not Jaundiced Lymph: Cervical, supraclavicular normal. Neurologic: Grossly non-focal Pertinent Labs As below  Health maintenance Vaccination  Vaccine Date last given comment  Influenza Nov 2024   Hepatitis B    Hepatitis A    Prevnar-PCV-13 07/06/16   Pneumovac-PPSV-23 08/31/16   TdaP 08/08/18   HPV Aged out   Shingrix  ( zoster vaccine) 08/19/19 and sept 2020 2 doses received   ______________________  Labs Lab Result  Date comment  HIV VL 60 10/12/22   CD4 950 10/12/22   Genotype     [id number]     HIV antibody     RPR NR 06/14/23   Quantiferon Gold NEG 10/12/22   Hep C ab NR 08/15/18   Hepatitis B-ab,ag,c Neg 08/15/18   Hepatitis A-IgM, IgG /T Neg 08/15/18   Lipid 128/66/30/ 08/2022   GC/CHL     PAP     HB,PLT,Cr, LFT Cr 0.9 08/15/18     Preventive  Procedure Result  Date comment  colonoscopy     Dental exam Uptodate Feb 2024 Tooth extraction  Opthal Uptodate      Impression/Recommendation ? ?HIV- on Dovato -doing well 100% adherent, last labs fromNov 2024  Vl 20 and cd4 758    SVT followed cardiology- propanolol which he was taking for migraine increased to BID dose.  Also on diltiazem   Not interested in HEPB/HEP A he wants to wait   DM-on JArdiance , trulicity ,  - managed by his PCP-last Hba1c  is 8.3    HTN on losartan   Hypertriglycerdemia- resolved ? Nicotine dependence= pt not ready to quit Pt has not been sexually active nor has had a partner since 2011 He does not want any STD screening or anal pap  ___________________________________________________ Discussed with patient in detail Follow up 8 months

## 2024-02-01 ENCOUNTER — Telehealth (HOSPITAL_COMMUNITY): Payer: Self-pay | Admitting: Emergency Medicine

## 2024-02-01 ENCOUNTER — Telehealth: Payer: Self-pay | Admitting: Pharmacy Technician

## 2024-02-01 LAB — T-HELPER CELLS CD4/CD8 %
% CD 4 Pos. Lymph.: 46.1 % (ref 30.8–58.5)
Absolute CD 4 Helper: 784 /uL (ref 359–1519)
Basophils Absolute: 0 x10E3/uL (ref 0.0–0.2)
Basos: 0 %
CD3+CD4+ Cells/CD3+CD8+ Cells Bld: 1.25 (ref 0.92–3.72)
CD3+CD8+ Cells # Bld: 629 /uL (ref 109–897)
CD3+CD8+ Cells NFr Bld: 37 % — ABNORMAL HIGH (ref 12.0–35.5)
EOS (ABSOLUTE): 0.1 x10E3/uL (ref 0.0–0.4)
Eos: 1 %
Hematocrit: 51.9 % — ABNORMAL HIGH (ref 37.5–51.0)
Hemoglobin: 17 g/dL (ref 13.0–17.7)
Immature Grans (Abs): 0 x10E3/uL (ref 0.0–0.1)
Immature Granulocytes: 0 %
Lymphocytes Absolute: 1.7 x10E3/uL (ref 0.7–3.1)
Lymphs: 18 %
MCH: 30.6 pg (ref 26.6–33.0)
MCHC: 32.8 g/dL (ref 31.5–35.7)
MCV: 93 fL (ref 79–97)
Monocytes Absolute: 0.5 x10E3/uL (ref 0.1–0.9)
Monocytes: 5 %
Neutrophils Absolute: 7.2 x10E3/uL — ABNORMAL HIGH (ref 1.4–7.0)
Neutrophils: 76 %
Platelets: 181 x10E3/uL (ref 150–450)
RBC: 5.56 x10E6/uL (ref 4.14–5.80)
RDW: 13.7 % (ref 11.6–15.4)
WBC: 9.6 x10E3/uL (ref 3.4–10.8)

## 2024-02-01 LAB — COMPREHENSIVE METABOLIC PANEL WITH GFR
ALT: 11 IU/L (ref 0–44)
AST: 12 IU/L (ref 0–40)
Albumin: 4.4 g/dL (ref 3.9–4.9)
Alkaline Phosphatase: 123 IU/L — ABNORMAL HIGH (ref 44–121)
BUN/Creatinine Ratio: 17 (ref 10–24)
BUN: 15 mg/dL (ref 8–27)
Bilirubin Total: 0.4 mg/dL (ref 0.0–1.2)
CO2: 17 mmol/L — ABNORMAL LOW (ref 20–29)
Calcium: 9.4 mg/dL (ref 8.6–10.2)
Chloride: 106 mmol/L (ref 96–106)
Creatinine, Ser: 0.87 mg/dL (ref 0.76–1.27)
Globulin, Total: 2.2 g/dL (ref 1.5–4.5)
Glucose: 278 mg/dL — ABNORMAL HIGH (ref 70–99)
Potassium: 3.7 mmol/L (ref 3.5–5.2)
Sodium: 138 mmol/L (ref 134–144)
Total Protein: 6.6 g/dL (ref 6.0–8.5)
eGFR: 95 mL/min/1.73 (ref >59)

## 2024-02-01 LAB — HIV-1 RNA QUANT-NO REFLEX-BLD: HIV-1 RNA Viral Load: <20 {copies}/mL

## 2024-02-01 LAB — RPR: RPR Ser Ql: NONREACTIVE

## 2024-02-01 MED ORDER — IVABRADINE HCL 5 MG PO TABS: 15.0000 mg | ORAL_TABLET | Freq: Once | ORAL | 0 refills | Status: AC

## 2024-02-01 MED ORDER — METOPROLOL TARTRATE 100 MG PO TABS: 100.0000 mg | ORAL_TABLET | Freq: Once | ORAL | 0 refills | Status: AC

## 2024-02-01 NOTE — Telephone Encounter (Signed)
 Pharmacy Patient Advocate Encounter   Received notification from Fax that prior authorization for Ivabradine  is required/requested.      Sent pharmacy a goodRx coupon as [place] is not approved for #3 tablets

## 2024-02-01 NOTE — Telephone Encounter (Signed)
Reaching out to patient to offer assistance regarding upcoming cardiac imaging study; pt verbalizes understanding of appt date/time, parking situation and where to check in, pre-test NPO status and medications ordered, and verified current allergies; name and call back number provided for further questions should they arise Marchia Bond RN Navigator Cardiac Imaging [facility] [phone] office [phone] cell  '100mg'$  metoprolol + '15mg'$  ivabradine

## 2024-02-02 ENCOUNTER — Ambulatory Visit
Admission: RE | Admit: 2024-02-02 | Discharge: 2024-02-02 | Disposition: A | Discharge status: Home / Self Care | Source: Ambulatory Visit | Attending: Nurse Practitioner | Admitting: Nurse Practitioner

## 2024-02-02 DIAGNOSIS — N182 Chronic kidney disease, stage 2 (mild): Secondary | ICD-10-CM | POA: Insufficient documentation

## 2024-02-02 DIAGNOSIS — K30 Functional dyspepsia: Secondary | ICD-10-CM | POA: Diagnosis present

## 2024-02-02 DIAGNOSIS — T466X5A Adverse effect of antihyperlipidemic and antiarteriosclerotic drugs, initial encounter: Secondary | ICD-10-CM | POA: Diagnosis present

## 2024-02-02 DIAGNOSIS — E1122 Type 2 diabetes mellitus with diabetic chronic kidney disease: Secondary | ICD-10-CM | POA: Insufficient documentation

## 2024-02-02 DIAGNOSIS — G720 Drug-induced myopathy: Secondary | ICD-10-CM | POA: Insufficient documentation

## 2024-02-02 DIAGNOSIS — R42 Dizziness and giddiness: Secondary | ICD-10-CM | POA: Diagnosis present

## 2024-02-02 DIAGNOSIS — F17210 Nicotine dependence, cigarettes, uncomplicated: Secondary | ICD-10-CM | POA: Diagnosis present

## 2024-02-02 DIAGNOSIS — I2510 Atherosclerotic heart disease of native coronary artery without angina pectoris: Secondary | ICD-10-CM | POA: Insufficient documentation

## 2024-02-02 DIAGNOSIS — I10 Essential (primary) hypertension: Secondary | ICD-10-CM | POA: Insufficient documentation

## 2024-02-02 MED ORDER — DILTIAZEM HCL 25 MG/5ML IV SOLN
10.0000 mg | INTRAVENOUS | Status: DC | PRN
  Filled 2024-02-02: qty 5

## 2024-02-02 MED ORDER — NITROGLYCERIN 0.4 MG SL SUBL
0.8000 mg | SUBLINGUAL_TABLET | Freq: Once | SUBLINGUAL | Status: AC
  Administered 2024-02-02: 0.8 mg via SUBLINGUAL
  Filled 2024-02-02: qty 25

## 2024-02-02 MED ORDER — IOHEXOL 350 MG/ML SOLN
100.0000 mL | Freq: Once | INTRAVENOUS | Status: AC | PRN
  Administered 2024-02-02: 100 mL via INTRAVENOUS

## 2024-02-02 MED ORDER — METOPROLOL TARTRATE 5 MG/5ML IV SOLN
10.0000 mg | Freq: Once | INTRAVENOUS | Status: DC | PRN
  Filled 2024-02-02: qty 10

## 2024-02-02 NOTE — Progress Notes (Signed)
 Patient tolerated CT well. Vital signs stable encourage to drink water throughout day.Reasons explained and verbalized understanding. Ambulated steady gait.

## 2024-02-14 ENCOUNTER — Ambulatory Visit: Payer: Self-pay

## 2024-08-21 ENCOUNTER — Other Ambulatory Visit: Payer: Self-pay | Admitting: Acute Care

## 2024-08-21 DIAGNOSIS — Z87891 Personal history of nicotine dependence: Secondary | ICD-10-CM

## 2024-08-21 DIAGNOSIS — Z122 Encounter for screening for malignant neoplasm of respiratory organs: Secondary | ICD-10-CM

## 2024-08-21 DIAGNOSIS — F17210 Nicotine dependence, cigarettes, uncomplicated: Secondary | ICD-10-CM

## 2024-09-04 ENCOUNTER — Other Ambulatory Visit

## 2024-11-29 ENCOUNTER — Ambulatory Visit: Admitting: Infectious Diseases
